# Patient Record
Sex: Female | Born: 1980
Health system: Southern US, Community
[De-identification: ages and names within clinical notes are randomized; demographics above are authoritative.]

## PROBLEM LIST (undated history)

## (undated) DIAGNOSIS — D6800 Von Willebrand disease, unspecified: Secondary | ICD-10-CM

## (undated) DIAGNOSIS — N200 Calculus of kidney: Secondary | ICD-10-CM

## (undated) DIAGNOSIS — L309 Dermatitis, unspecified: Secondary | ICD-10-CM

## (undated) DIAGNOSIS — D689 Coagulation defect, unspecified: Secondary | ICD-10-CM

## (undated) DIAGNOSIS — N189 Chronic kidney disease, unspecified: Secondary | ICD-10-CM

## (undated) DIAGNOSIS — B009 Herpesviral infection, unspecified: Secondary | ICD-10-CM

## (undated) DIAGNOSIS — N23 Unspecified renal colic: Secondary | ICD-10-CM

## (undated) DIAGNOSIS — F459 Somatoform disorder, unspecified: Secondary | ICD-10-CM

## (undated) DIAGNOSIS — K219 Gastro-esophageal reflux disease without esophagitis: Secondary | ICD-10-CM

## (undated) DIAGNOSIS — Z5189 Encounter for other specified aftercare: Secondary | ICD-10-CM

## (undated) HISTORY — PX: TONSILLECTOMY: SUR1361

## (undated) HISTORY — DX: Calculus of kidney: N20.0

## (undated) HISTORY — PX: OTHER SURGICAL HISTORY: SHX169

## (undated) HISTORY — DX: Herpesviral infection, unspecified: B00.9

## (undated) HISTORY — DX: Gastro-esophageal reflux disease without esophagitis: K21.9

## (undated) HISTORY — DX: Encounter for other specified aftercare: Z51.89

## (undated) HISTORY — DX: Coagulation defect, unspecified: D68.9

## (undated) HISTORY — DX: Somatoform disorder, unspecified: F45.9

## (undated) HISTORY — DX: Unspecified renal colic: N23

## (undated) HISTORY — DX: Dermatitis, unspecified: L30.9

## (undated) HISTORY — PX: KIDNEY STONE SURGERY: SHX686

## (undated) HISTORY — DX: Chronic kidney disease, unspecified: N18.9

## (undated) HISTORY — DX: Von Willebrand disease, unspecified: D68.00

---

## 2001-02-08 ENCOUNTER — Other Ambulatory Visit: Admission: RE | Admit: 2001-02-08 | Discharge: 2001-02-08 | Payer: Self-pay | Admitting: *Deleted

## 2002-07-29 ENCOUNTER — Other Ambulatory Visit: Admission: RE | Admit: 2002-07-29 | Discharge: 2002-07-29 | Payer: Self-pay | Admitting: Obstetrics and Gynecology

## 2003-08-17 ENCOUNTER — Other Ambulatory Visit: Admission: RE | Admit: 2003-08-17 | Discharge: 2003-08-17 | Payer: Self-pay | Admitting: Obstetrics and Gynecology

## 2003-09-14 ENCOUNTER — Ambulatory Visit (HOSPITAL_COMMUNITY): Admission: RE | Admit: 2003-09-14 | Discharge: 2003-09-14 | Payer: Self-pay

## 2003-12-18 ENCOUNTER — Emergency Department (HOSPITAL_COMMUNITY): Admission: EM | Admit: 2003-12-18 | Discharge: 2003-12-18 | Payer: Self-pay | Admitting: Emergency Medicine

## 2003-12-29 ENCOUNTER — Emergency Department (HOSPITAL_COMMUNITY): Admission: EM | Admit: 2003-12-29 | Discharge: 2003-12-29 | Payer: Self-pay | Admitting: Emergency Medicine

## 2005-03-09 ENCOUNTER — Inpatient Hospital Stay (HOSPITAL_COMMUNITY): Admission: AD | Admit: 2005-03-09 | Discharge: 2005-03-11 | Payer: Self-pay | Admitting: Obstetrics and Gynecology

## 2005-03-15 ENCOUNTER — Inpatient Hospital Stay (HOSPITAL_COMMUNITY): Admission: AD | Admit: 2005-03-15 | Discharge: 2005-03-18 | Payer: Self-pay | Admitting: Obstetrics and Gynecology

## 2005-06-10 ENCOUNTER — Emergency Department (HOSPITAL_COMMUNITY): Admission: EM | Admit: 2005-06-10 | Discharge: 2005-06-10 | Payer: Self-pay | Admitting: Family Medicine

## 2005-10-11 ENCOUNTER — Emergency Department (HOSPITAL_COMMUNITY): Admission: EM | Admit: 2005-10-11 | Discharge: 2005-10-11 | Payer: Self-pay | Admitting: Emergency Medicine

## 2006-01-15 ENCOUNTER — Ambulatory Visit: Payer: Self-pay | Admitting: Hematology & Oncology

## 2006-01-15 LAB — CBC WITH DIFFERENTIAL/PLATELET
Basophils Absolute: 0 10*3/uL (ref 0.0–0.1)
Eosinophils Absolute: 0.2 10*3/uL (ref 0.0–0.5)
HCT: 43.6 % (ref 34.8–46.6)
HGB: 15.1 g/dL (ref 11.6–15.9)
LYMPH%: 40.2 % (ref 14.0–48.0)
MCV: 92.1 fL (ref 81.0–101.0)
MONO#: 0.4 10*3/uL (ref 0.1–0.9)
MONO%: 5.3 % (ref 0.0–13.0)
NEUT#: 3.6 10*3/uL (ref 1.5–6.5)
Platelets: 328 10*3/uL (ref 145–400)
WBC: 7 10*3/uL (ref 3.9–10.0)

## 2006-01-15 LAB — CHCC SMEAR

## 2006-01-20 LAB — VON WILLEBRAND FACTOR MULTIMER: Von Willebrand Ag: 63 % normal (ref 61–164)

## 2006-11-06 ENCOUNTER — Ambulatory Visit (HOSPITAL_COMMUNITY): Admission: RE | Admit: 2006-11-06 | Discharge: 2006-11-06 | Payer: Self-pay | Admitting: Urology

## 2006-11-19 ENCOUNTER — Inpatient Hospital Stay (HOSPITAL_COMMUNITY): Admission: AD | Admit: 2006-11-19 | Discharge: 2006-11-19 | Payer: Self-pay | Admitting: Obstetrics and Gynecology

## 2006-11-26 ENCOUNTER — Ambulatory Visit (HOSPITAL_COMMUNITY): Admission: RE | Admit: 2006-11-26 | Discharge: 2006-11-26 | Payer: Self-pay | Admitting: *Deleted

## 2006-12-05 ENCOUNTER — Inpatient Hospital Stay (HOSPITAL_COMMUNITY): Admission: AD | Admit: 2006-12-05 | Discharge: 2006-12-07 | Payer: Self-pay | Admitting: Obstetrics & Gynecology

## 2007-01-02 ENCOUNTER — Ambulatory Visit (HOSPITAL_COMMUNITY): Admission: RE | Admit: 2007-01-02 | Discharge: 2007-01-02 | Payer: Self-pay | Admitting: Urology

## 2007-01-04 ENCOUNTER — Encounter: Admission: RE | Admit: 2007-01-04 | Discharge: 2007-02-03 | Payer: Self-pay | Admitting: *Deleted

## 2007-06-19 ENCOUNTER — Ambulatory Visit (HOSPITAL_BASED_OUTPATIENT_CLINIC_OR_DEPARTMENT_OTHER): Admission: RE | Admit: 2007-06-19 | Discharge: 2007-06-19 | Payer: Self-pay | Admitting: Urology

## 2007-09-01 ENCOUNTER — Emergency Department (HOSPITAL_COMMUNITY): Admission: EM | Admit: 2007-09-01 | Discharge: 2007-09-01 | Payer: Self-pay | Admitting: Emergency Medicine

## 2007-09-16 ENCOUNTER — Emergency Department (HOSPITAL_COMMUNITY): Admission: EM | Admit: 2007-09-16 | Discharge: 2007-09-16 | Payer: Self-pay | Admitting: Emergency Medicine

## 2008-01-11 ENCOUNTER — Inpatient Hospital Stay (HOSPITAL_COMMUNITY): Admission: AD | Admit: 2008-01-11 | Discharge: 2008-01-16 | Payer: Self-pay | Admitting: Obstetrics and Gynecology

## 2008-01-13 ENCOUNTER — Encounter: Payer: Self-pay | Admitting: Urology

## 2008-03-04 ENCOUNTER — Ambulatory Visit (HOSPITAL_COMMUNITY): Admission: RE | Admit: 2008-03-04 | Discharge: 2008-03-04 | Payer: Self-pay | Admitting: Obstetrics and Gynecology

## 2008-03-05 ENCOUNTER — Observation Stay (HOSPITAL_COMMUNITY): Admission: AD | Admit: 2008-03-05 | Discharge: 2008-03-07 | Payer: Self-pay | Admitting: Obstetrics & Gynecology

## 2008-03-14 ENCOUNTER — Inpatient Hospital Stay (HOSPITAL_COMMUNITY): Admission: AD | Admit: 2008-03-14 | Discharge: 2008-03-14 | Payer: Self-pay | Admitting: Obstetrics and Gynecology

## 2008-03-19 ENCOUNTER — Ambulatory Visit (HOSPITAL_COMMUNITY): Admission: RE | Admit: 2008-03-19 | Discharge: 2008-03-19 | Payer: Self-pay | Admitting: Urology

## 2008-04-10 ENCOUNTER — Ambulatory Visit: Payer: Self-pay | Admitting: Hematology & Oncology

## 2008-04-10 ENCOUNTER — Inpatient Hospital Stay (HOSPITAL_COMMUNITY): Admission: AD | Admit: 2008-04-10 | Discharge: 2008-04-11 | Payer: Self-pay | Admitting: Family Medicine

## 2008-04-13 LAB — CBC WITH DIFFERENTIAL (CANCER CENTER ONLY)
HCT: 38.1 % (ref 34.8–46.6)
HGB: 13.1 g/dL (ref 11.6–15.9)
MCV: 93 fL (ref 81–101)
RDW: 11.6 % (ref 10.5–14.6)
WBC: 15.7 10*3/uL — ABNORMAL HIGH (ref 3.9–10.0)

## 2008-04-13 LAB — PROTIME-INR (CHCC SATELLITE)

## 2008-04-13 LAB — MANUAL DIFFERENTIAL (CHCC SATELLITE)
ANC (CHCC HP manual diff): 11.8 10*3/uL — ABNORMAL HIGH (ref 1.5–6.7)
BASO: 1 % (ref 0–2)
Band Neutrophils: 6 % (ref 0–10)
Eos: 1 % (ref 0–7)
PLT EST ~~LOC~~: ADEQUATE

## 2008-04-20 ENCOUNTER — Ambulatory Visit (HOSPITAL_COMMUNITY): Admission: RE | Admit: 2008-04-20 | Discharge: 2008-04-20 | Payer: Self-pay | Admitting: Urology

## 2008-04-20 ENCOUNTER — Observation Stay (HOSPITAL_COMMUNITY): Admission: AD | Admit: 2008-04-20 | Discharge: 2008-04-21 | Payer: Self-pay | Admitting: Obstetrics & Gynecology

## 2008-04-20 LAB — APTT: aPTT: 35 seconds (ref 24–37)

## 2008-04-23 ENCOUNTER — Ambulatory Visit: Payer: Self-pay

## 2008-04-24 ENCOUNTER — Ambulatory Visit: Payer: Self-pay

## 2008-04-24 ENCOUNTER — Ambulatory Visit (HOSPITAL_COMMUNITY): Admission: RE | Admit: 2008-04-24 | Discharge: 2008-04-24 | Payer: Self-pay | Admitting: Urology

## 2008-05-26 ENCOUNTER — Ambulatory Visit (HOSPITAL_COMMUNITY): Admission: RE | Admit: 2008-05-26 | Discharge: 2008-05-26 | Payer: Self-pay | Admitting: Urology

## 2008-05-31 ENCOUNTER — Inpatient Hospital Stay (HOSPITAL_COMMUNITY): Admission: AD | Admit: 2008-05-31 | Discharge: 2008-06-02 | Payer: Self-pay | Admitting: Obstetrics and Gynecology

## 2008-06-01 ENCOUNTER — Encounter: Payer: Self-pay | Admitting: Urology

## 2008-06-03 ENCOUNTER — Encounter: Admission: RE | Admit: 2008-06-03 | Discharge: 2008-07-03 | Payer: Self-pay | Admitting: Obstetrics and Gynecology

## 2008-06-07 ENCOUNTER — Emergency Department (HOSPITAL_COMMUNITY): Admission: EM | Admit: 2008-06-07 | Discharge: 2008-06-08 | Payer: Self-pay | Admitting: Emergency Medicine

## 2008-06-09 ENCOUNTER — Ambulatory Visit (HOSPITAL_COMMUNITY): Admission: RE | Admit: 2008-06-09 | Discharge: 2008-06-09 | Payer: Self-pay | Admitting: Urology

## 2008-06-11 ENCOUNTER — Ambulatory Visit: Payer: Self-pay

## 2008-06-22 ENCOUNTER — Ambulatory Visit: Payer: Self-pay | Admitting: Vascular Surgery

## 2008-06-22 ENCOUNTER — Emergency Department (HOSPITAL_COMMUNITY): Admission: EM | Admit: 2008-06-22 | Discharge: 2008-06-22 | Payer: Self-pay | Admitting: Advanced Practice Midwife

## 2008-06-22 ENCOUNTER — Encounter (INDEPENDENT_AMBULATORY_CARE_PROVIDER_SITE_OTHER): Payer: Self-pay | Admitting: Emergency Medicine

## 2008-06-22 LAB — CONVERTED CEMR LAB
Pap Smear: NORMAL
Pap Smear: NORMAL
Pap Smear: NORMAL

## 2008-06-22 LAB — HM PAP SMEAR

## 2008-07-04 ENCOUNTER — Encounter: Admission: RE | Admit: 2008-07-04 | Discharge: 2008-07-22 | Payer: Self-pay | Admitting: Obstetrics and Gynecology

## 2008-08-19 ENCOUNTER — Ambulatory Visit: Payer: Self-pay | Admitting: Family Medicine

## 2008-08-19 DIAGNOSIS — N200 Calculus of kidney: Secondary | ICD-10-CM

## 2008-08-19 DIAGNOSIS — D6800 Von Willebrand disease, unspecified: Secondary | ICD-10-CM | POA: Insufficient documentation

## 2008-08-19 DIAGNOSIS — N209 Urinary calculus, unspecified: Secondary | ICD-10-CM

## 2008-08-19 DIAGNOSIS — Z87448 Personal history of other diseases of urinary system: Secondary | ICD-10-CM | POA: Insufficient documentation

## 2008-08-19 DIAGNOSIS — F4320 Adjustment disorder, unspecified: Secondary | ICD-10-CM | POA: Insufficient documentation

## 2008-08-19 HISTORY — DX: Urinary calculus, unspecified: N20.9

## 2008-09-08 ENCOUNTER — Encounter: Payer: Self-pay | Admitting: Family Medicine

## 2008-09-21 ENCOUNTER — Ambulatory Visit: Payer: Self-pay | Admitting: Hematology & Oncology

## 2008-09-24 ENCOUNTER — Ambulatory Visit: Payer: Self-pay | Admitting: Family Medicine

## 2008-09-24 DIAGNOSIS — L600 Ingrowing nail: Secondary | ICD-10-CM

## 2008-09-25 LAB — CONVERTED CEMR LAB
ALT: 22 units/L (ref 0–35)
AST: 20 units/L (ref 0–37)
Alkaline Phosphatase: 100 units/L (ref 39–117)
Bilirubin, Direct: 0 mg/dL (ref 0.0–0.3)
Chloride: 106 meq/L (ref 96–112)
GFR calc non Af Amer: 91.13 mL/min (ref >60)
Potassium: 4.3 meq/L (ref 3.5–5.1)
Sodium: 141 meq/L (ref 135–145)
Total Bilirubin: 0.7 mg/dL (ref 0.3–1.2)

## 2008-11-11 ENCOUNTER — Ambulatory Visit (HOSPITAL_COMMUNITY): Admission: RE | Admit: 2008-11-11 | Discharge: 2008-11-11 | Payer: Self-pay | Admitting: Sports Medicine

## 2008-12-14 ENCOUNTER — Encounter: Payer: Self-pay | Admitting: Family Medicine

## 2009-01-21 ENCOUNTER — Telehealth: Payer: Self-pay | Admitting: Family Medicine

## 2009-02-01 ENCOUNTER — Encounter: Admission: RE | Admit: 2009-02-01 | Discharge: 2009-02-01 | Payer: Self-pay | Admitting: Internal Medicine

## 2009-05-07 ENCOUNTER — Telehealth: Payer: Self-pay | Admitting: Family Medicine

## 2009-05-13 ENCOUNTER — Telehealth: Payer: Self-pay | Admitting: Internal Medicine

## 2009-05-17 ENCOUNTER — Emergency Department (HOSPITAL_COMMUNITY): Admission: EM | Admit: 2009-05-17 | Discharge: 2009-05-17 | Payer: Self-pay | Admitting: Emergency Medicine

## 2009-05-18 ENCOUNTER — Ambulatory Visit: Payer: Self-pay | Admitting: Unknown Physician Specialty

## 2009-05-22 ENCOUNTER — Ambulatory Visit: Payer: Self-pay | Admitting: Unknown Physician Specialty

## 2009-06-15 ENCOUNTER — Ambulatory Visit: Payer: Self-pay | Admitting: Family Medicine

## 2009-06-15 DIAGNOSIS — R1011 Right upper quadrant pain: Secondary | ICD-10-CM

## 2009-06-18 LAB — CONVERTED CEMR LAB
Albumin: 4.7 g/dL (ref 3.5–5.2)
BUN: 12 mg/dL (ref 6–23)
Basophils Absolute: 0 10*3/uL (ref 0.0–0.1)
CO2: 26 meq/L (ref 19–32)
Calcium: 9.8 mg/dL (ref 8.4–10.5)
Eosinophils Absolute: 0.4 10*3/uL (ref 0.0–0.7)
GFR calc non Af Amer: 105.75 mL/min (ref >60)
Glucose, Bld: 97 mg/dL (ref 70–99)
HCT: 45.2 % (ref 36.0–46.0)
Hemoglobin: 15.2 g/dL — ABNORMAL HIGH (ref 12.0–15.0)
Lipase: 27 units/L (ref 11.0–59.0)
Lymphocytes Relative: 27.6 % (ref 12.0–46.0)
Lymphs Abs: 3.5 10*3/uL (ref 0.7–4.0)
MCHC: 33.5 g/dL (ref 30.0–36.0)
Monocytes Relative: 5.9 % (ref 3.0–12.0)
Neutro Abs: 8.1 10*3/uL — ABNORMAL HIGH (ref 1.4–7.7)
Platelets: 323 10*3/uL (ref 150.0–400.0)
RDW: 11.6 % (ref 11.5–14.6)
Sodium: 139 meq/L (ref 135–145)
Total Bilirubin: 0.7 mg/dL (ref 0.3–1.2)

## 2009-06-21 ENCOUNTER — Ambulatory Visit: Payer: Self-pay | Admitting: Family Medicine

## 2009-06-21 ENCOUNTER — Ambulatory Visit: Payer: Self-pay | Admitting: Cardiology

## 2009-06-21 LAB — CONVERTED CEMR LAB
Basophils Relative: 0.8 % (ref 0.0–3.0)
Eosinophils Relative: 3.5 % (ref 0.0–5.0)
HCT: 42.6 % (ref 36.0–46.0)
Hemoglobin: 14.7 g/dL (ref 12.0–15.0)
Lymphocytes Relative: 30.1 % (ref 12.0–46.0)
Lymphs Abs: 3.1 10*3/uL (ref 0.7–4.0)
Monocytes Relative: 5.5 % (ref 3.0–12.0)
Neutro Abs: 6 10*3/uL (ref 1.4–7.7)
RBC: 4.44 M/uL (ref 3.87–5.11)

## 2009-06-22 ENCOUNTER — Ambulatory Visit: Payer: Self-pay | Admitting: Unknown Physician Specialty

## 2009-12-16 ENCOUNTER — Ambulatory Visit: Payer: Self-pay | Admitting: Family Medicine

## 2009-12-16 DIAGNOSIS — Z9189 Other specified personal risk factors, not elsewhere classified: Secondary | ICD-10-CM | POA: Insufficient documentation

## 2010-02-16 ENCOUNTER — Ambulatory Visit: Payer: Self-pay | Admitting: Family Medicine

## 2010-03-24 IMAGING — CR DG NEPHROSTOGRAM RIGHT
1 series · 1 of 1 positions shown · IV contrast (agent unspecified)
Comparison: 05/26/2008

CLINICAL HISTORY: Chronic right ureteral obstruction.  Right
nephrostomy required during pregnancy.  The patient's pregnancy end
of yesterday with delivery.

SP NEPHROSTOGRAM*R*
TECHNIQUE: Fluoroscopic guided injection of contrast into the
right urinary tract via existing nephrostomy catheter
Contrast:  50 ml Umnipaque-TBB.
Fluoro time 1.4 minutes.

[view not recorded]
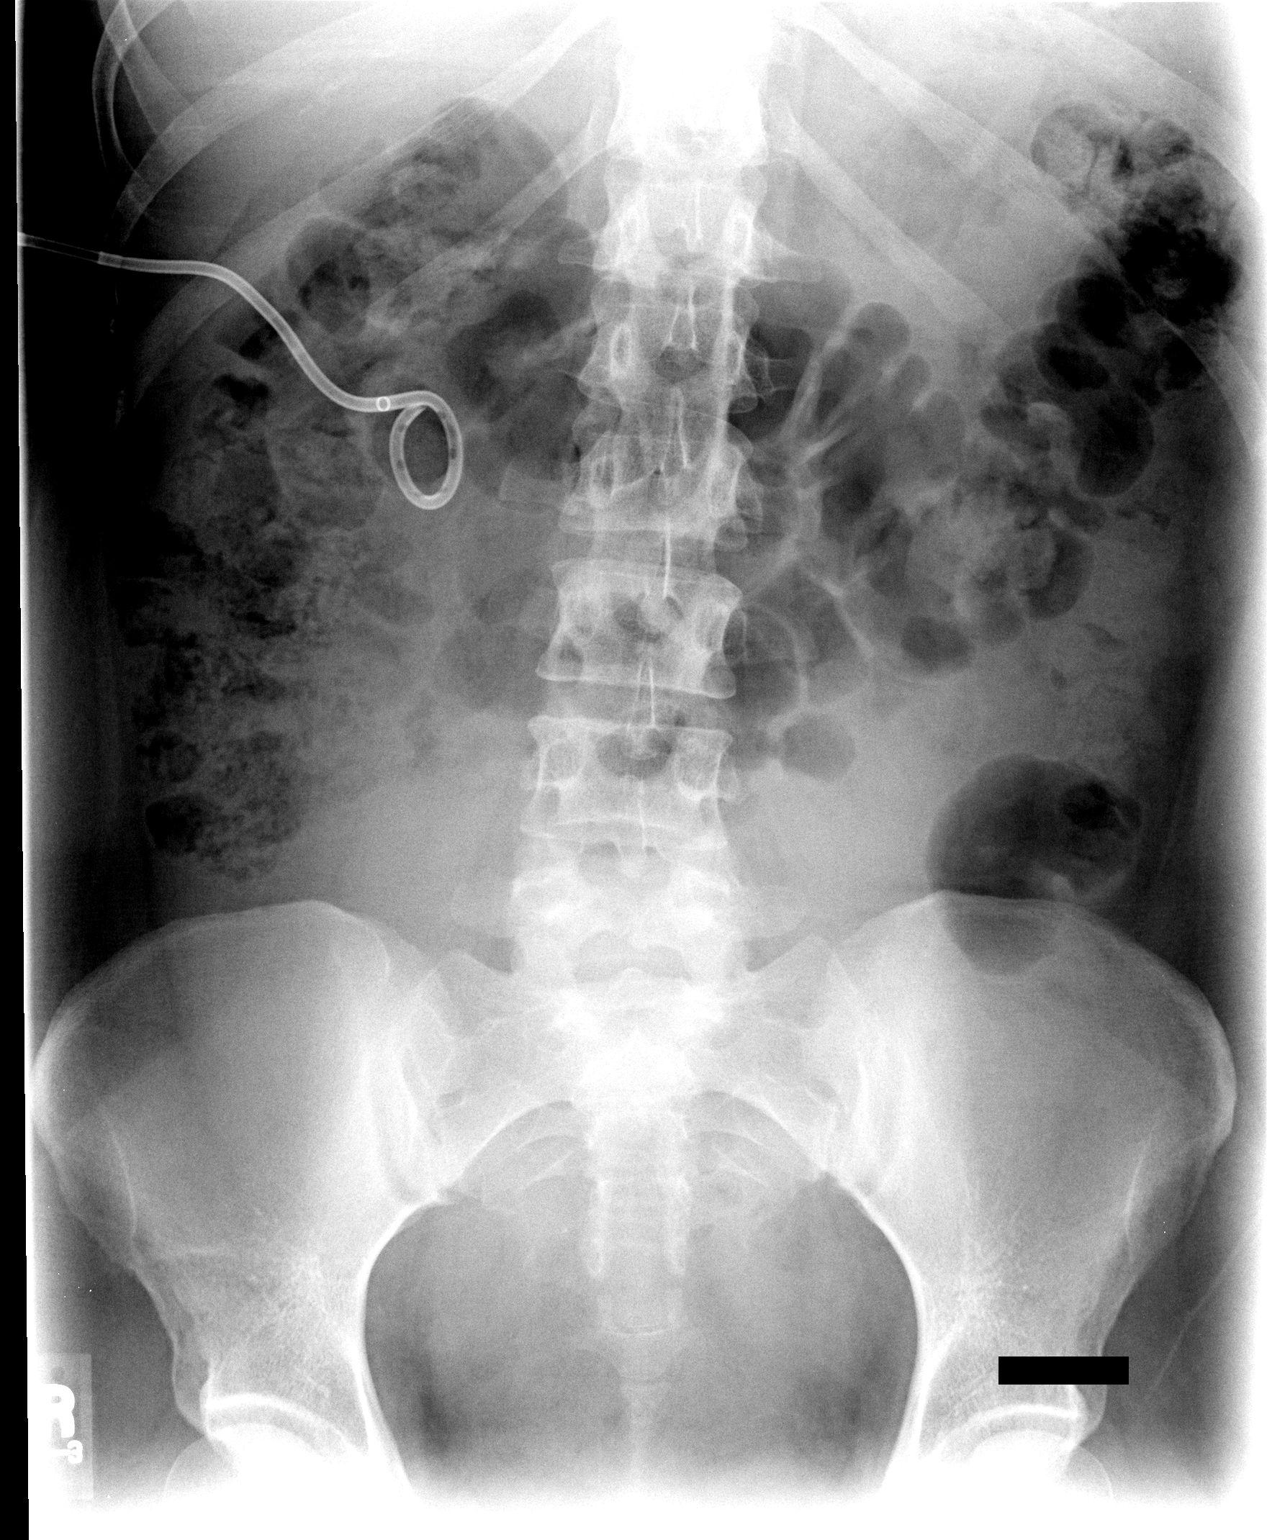

[1 of 1 positions shown; findings below may reference images not displayed]

FINDINGS: The right intra - renal collecting system is moderately
dilated.  The upper half of the ureter is markedly tortuous and
moderately dilated.  The lower half of the ureter is mildly
dilated.  There are no visible stones, filling defects, or
obstruction.
IMPRESSION: Dilated system, especially the upper ureter and
intrarenal components, without stones or obstruction.

## 2010-03-31 IMAGING — CT CT ABDOMEN W/O CM
2 of 4 series · 17 of 46 positions shown, 19 images · non-contrast
Comparison: 03/17/2005

CT ABDOMEN

CLINICAL DATA: Right flank pain, fever, recently postpartum,
ago

CT ABDOMEN AND PELVIS WITHOUT CONTRAST
TECHNIQUE: Multidetector CT imaging of the abdomen and pelvis was
performed following the standard protocol without intravenous
contrast.

[Series 2: 160 stone 5.0 b40f · axial · 0.60mm/px · z∈[-496,-152]mm · 14 of 97 slices shown, 16 images]
[im 7/97  soft-tissue]
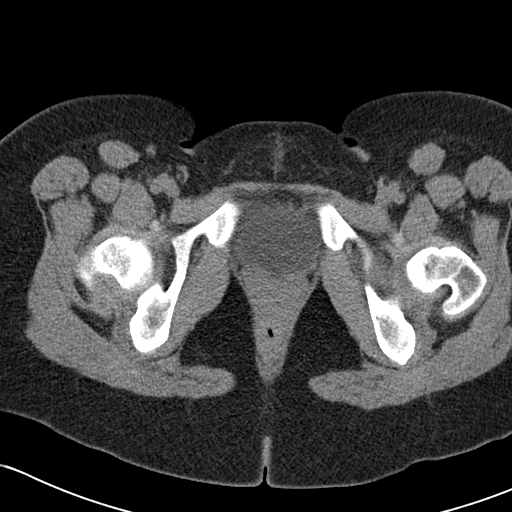
[im 7/97  bone]
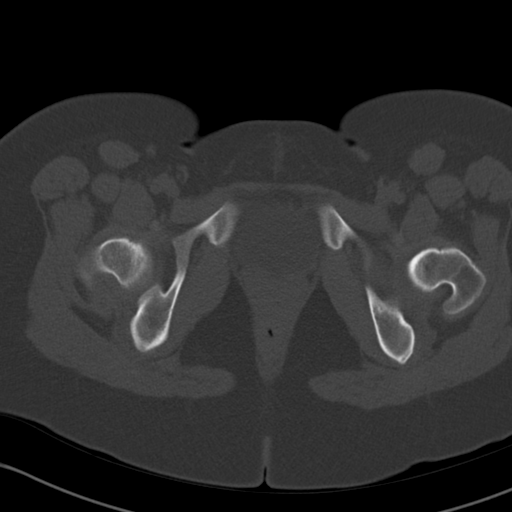
[im 14/97  soft-tissue]
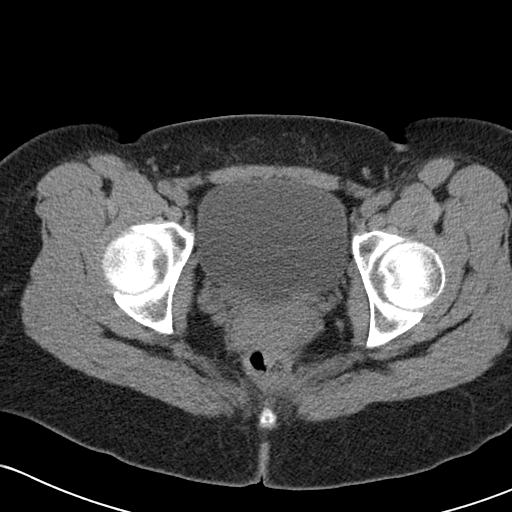
[im 20/97  soft-tissue]
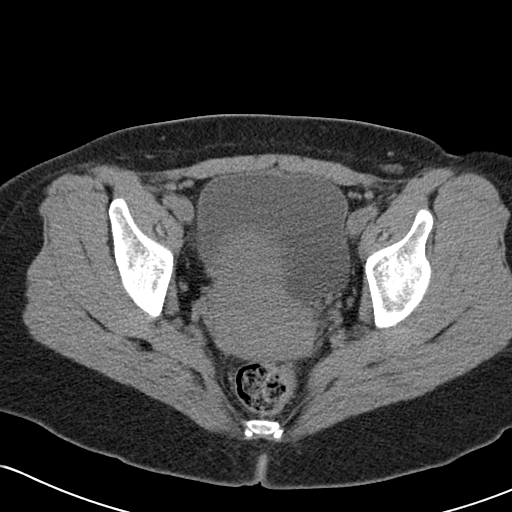
[im 27/97  soft-tissue]
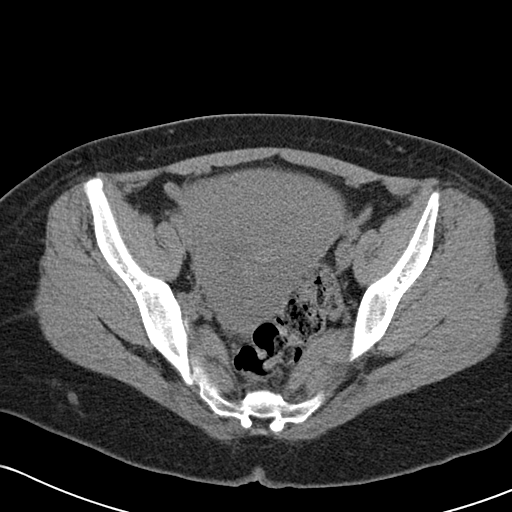
[im 34/97  soft-tissue]
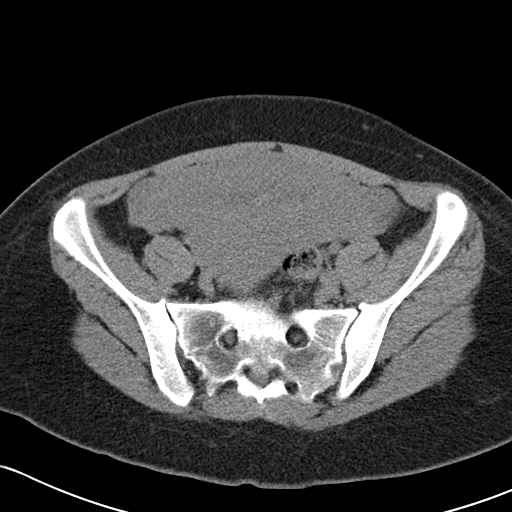
[im 40/97  soft-tissue]
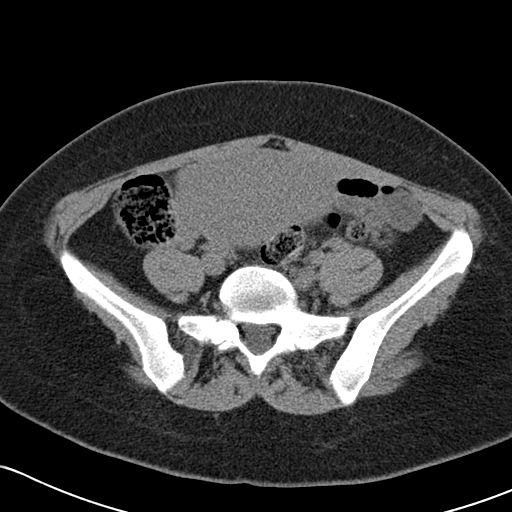
[im 47/97  soft-tissue]
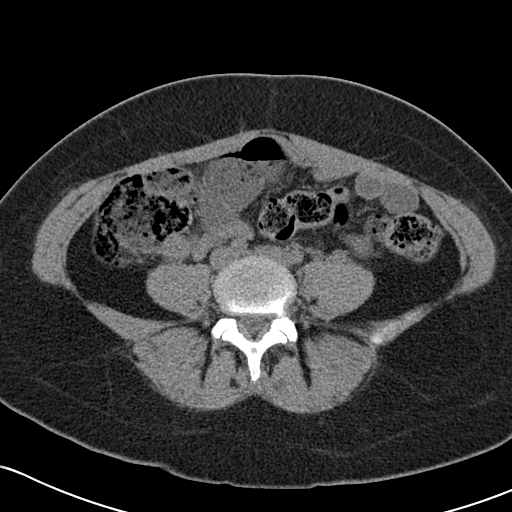
[im 53/97  soft-tissue]
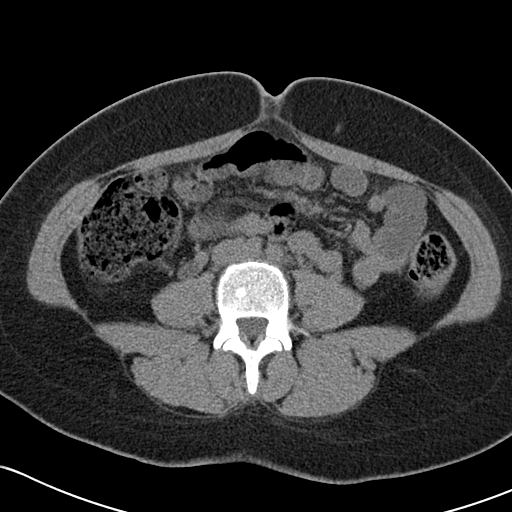
[im 60/97  soft-tissue]
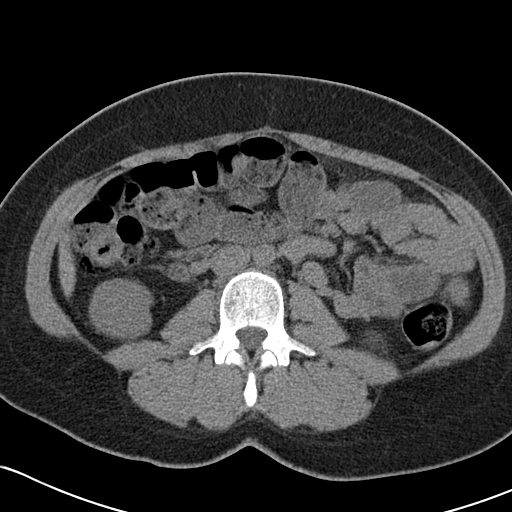
[im 60/97  bone]
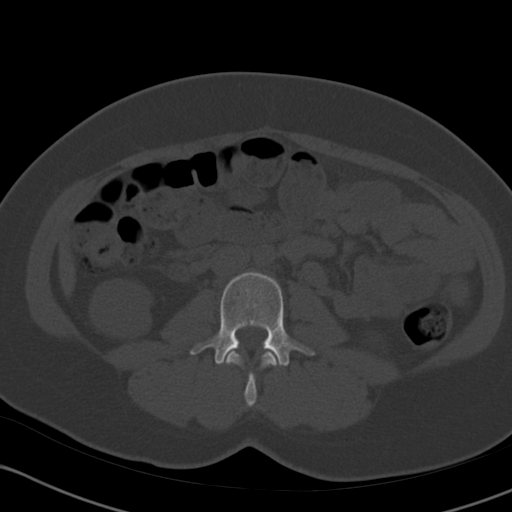
[im 67/97  soft-tissue]
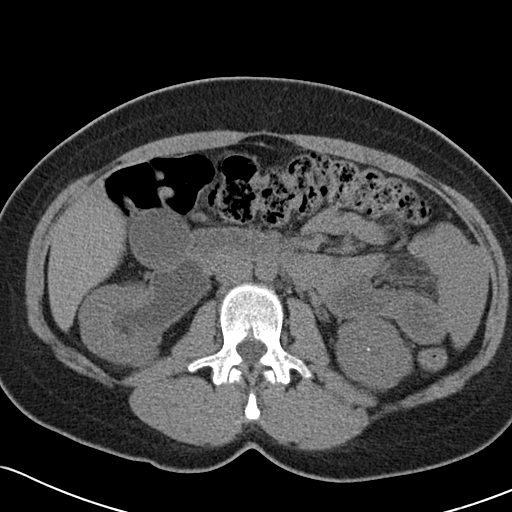
[im 73/97  soft-tissue]
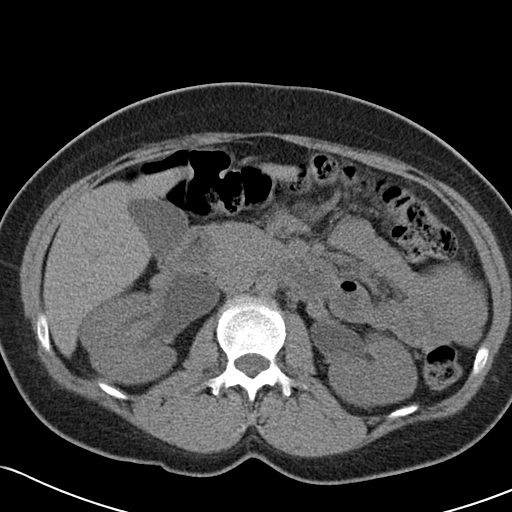
[im 80/97  soft-tissue]
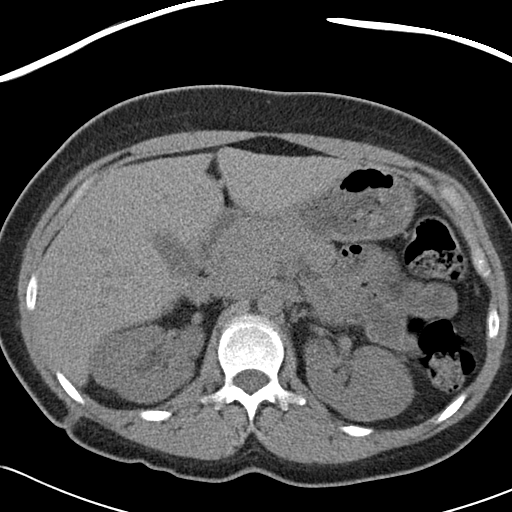
[im 87/97  soft-tissue]
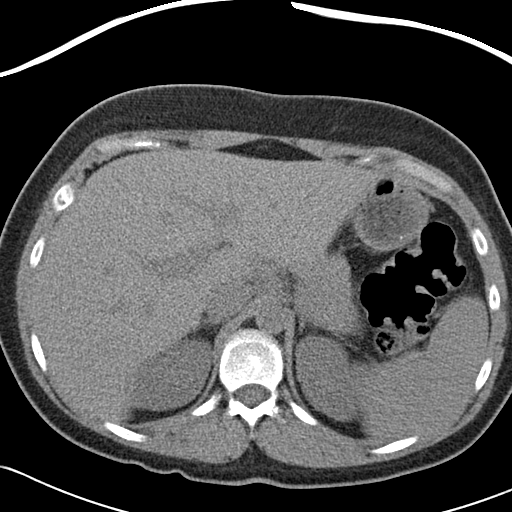
[im 93/97  soft-tissue]
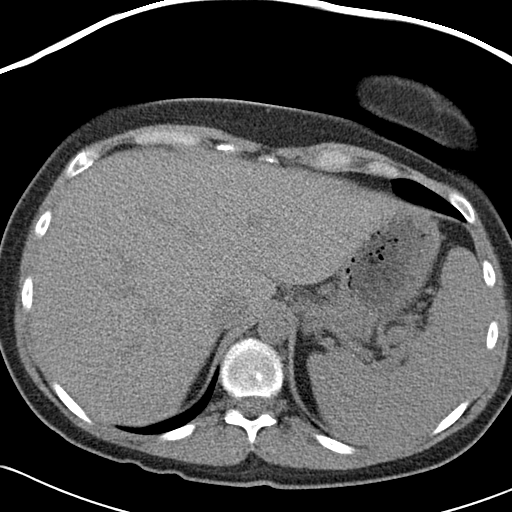

[Series 602: <mpr thick range> · coronal · 0.76mm/px · 3 of 67 slices shown]
[im 23/67  soft-tissue]
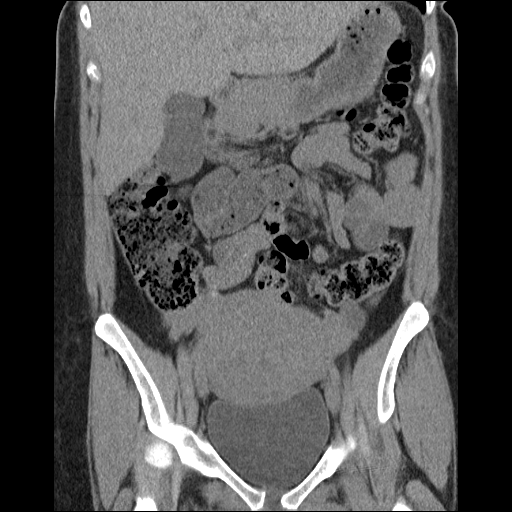
[im 30/67  soft-tissue]
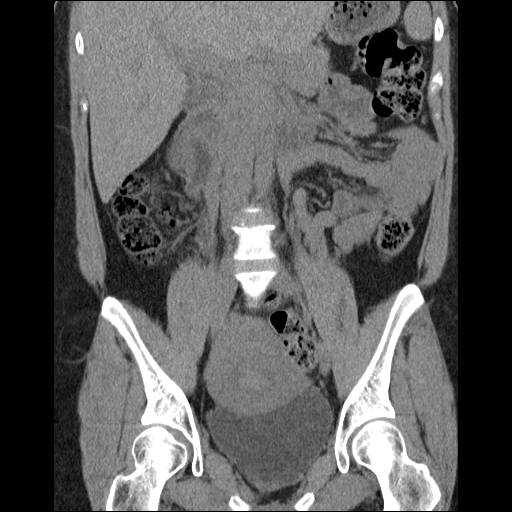
[im 37/67  soft-tissue]
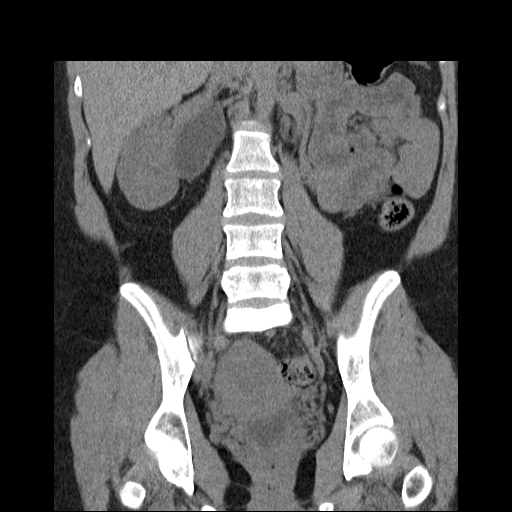

[17 of 46 positions shown; findings below may reference images not displayed]

FINDINGS: Right hydronephrosis with dilatation of proximal right ureter again
identified.
Degree of right hydronephrosis is improved versus 9776 exam.
Proximal ureteral dilatation terminates just above iliac crests,
without obstructing calculus identified.
Spleen appears minimally prominent in size.
Within limits of a nonenhanced exam, no additional focal
abnormalities of upper abdominal solid organs and bowel loops.
IMPRESSION: Mild to moderate right hydronephrosis with proximal right ureteral
dilatation, terminating just above iliac crest, of uncertain
etiology, with no stone identified.

CT PELVIS
FINDINGS: Enlarged uterus compatible with postpartum state.
Central high attenuation within uterus may reflect residual blood
in endometrial canal or less likely retained products of
conception.
Bladder distal ureters normal.
Large and small bowel loops in pelvis normal.
Appendix not definitely localized.
No pelvic mass, adenopathy, or free fluid.
No acute bony abnormalities seen.
IMPRESSION: Enlarged uterus with central high attenuation compatible with
postpartum state, cannot exclude endometrial blood or retained
products of conception.
No other intrapelvic abnormalities.

## 2010-04-18 ENCOUNTER — Telehealth (INDEPENDENT_AMBULATORY_CARE_PROVIDER_SITE_OTHER): Payer: Self-pay | Admitting: *Deleted

## 2010-04-19 ENCOUNTER — Ambulatory Visit: Payer: Self-pay | Admitting: Family Medicine

## 2010-04-20 LAB — CONVERTED CEMR LAB
Mumps IgG: 1.99 — ABNORMAL HIGH
Rubeola IgG: 2.31 — ABNORMAL HIGH

## 2010-04-21 ENCOUNTER — Ambulatory Visit: Payer: Self-pay | Admitting: Family Medicine

## 2010-05-23 ENCOUNTER — Ambulatory Visit
Admission: RE | Admit: 2010-05-23 | Discharge: 2010-05-23 | Payer: Self-pay | Source: Home / Self Care | Attending: Family Medicine | Admitting: Family Medicine

## 2010-05-23 DIAGNOSIS — J4520 Mild intermittent asthma, uncomplicated: Secondary | ICD-10-CM | POA: Insufficient documentation

## 2010-06-12 ENCOUNTER — Encounter: Payer: Self-pay | Admitting: Urology

## 2010-06-12 ENCOUNTER — Encounter: Payer: Self-pay | Admitting: *Deleted

## 2010-06-21 NOTE — Letter (Signed)
Summary: Physical Exam Form/[employer]  Physical Exam Form/[employer]   Imported By: Lanelle Bal 12/21/2009 11:03:40  _____________________________________________________________________  External Attachment:    Type:   Image     Comment:   External Document

## 2010-06-21 NOTE — Progress Notes (Signed)
Summary: needs MMR titre  Phone Note Call from Patient Call back at Work Phone [phone]   Caller: Patient Call For: Kerby Nora MD Summary of Call: Pt is coming in tomorrow for a PPD and is asking if she can get a MMR titre.  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 Initial call taken by: Lowella Petties CMA, AAMA,  April 18, 2010 5:03 PM  Follow-up for Phone Call        Yes MMR titer Dx v05.8 Follow-up by: Kerby Nora MD,  April 19, 2010 11:24 AM  Additional Follow-up for Phone Call Additional follow up Details #1::        Patient will not be her until 4:30 Additional Follow-up by: [name] CMA Duncan Dull),  April 19, 2010 11:28 AM    Additional Follow-up for Phone Call Additional follow up Details #2::    Noted. Follow-up by: Mills Koller,  April 19, 2010 11:58 AM

## 2010-06-21 NOTE — Assessment & Plan Note (Signed)
Summary: ppd reading/jrr bedsole  Nurse Visit   Allergies: 1)  ! Keflex  PPD Results    Date of reading: 04/21/2010    Results: < 5mm    Interpretation: negative

## 2010-06-21 NOTE — Assessment & Plan Note (Signed)
Summary: COLLEGE PHYSICAL,FILL OUT FORM/CLE   Vital Signs:  Patient profile:   30 year old female Height:      66.25 inches Weight:      210.6 pounds BMI:     33.86 Temp:     99.2 degrees F oral Pulse rate:   76 / minute Pulse rhythm:   regular BP sitting:   110 / 70  (left arm) Cuff size:   large  Vitals Entered By: [name] CMA Duncan Dull) (December 16, 2009 8:26 AM)  Vision Screening:Left eye with correction: 20 / 20 Right eye with correction: 20 / 20 Both eyes with correction: 20 / 20  Color vision testing: normal      Vision Entered By: [name] CMA Duncan Dull) (December 16, 2009 8:26 AM)   History of Present Illness: 30 year old going to college who needs verification of all vaccines, forms completed, feeling well, 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 Potential exposure and immunity titers are needed  ROS: GEN: No acute illnesses, no fevers, chills, sweats, fatigue, weight loss, or URI sx. GI: No n/v/d Pulm: No SOB, cough, wheezing Interactive and getting along well at home.  Otherwise, ROS is as per the HPI.   GEN: WDWN, NAD, Non-toxic, A & O x 3 HEENT: Atraumatic, Normocephalic. Neck supple. No masses, No LAD. Ears and Nose: No external deformity. CV: RRR, No M/G/R. No JVD. No thrill. No extra heart sounds. PULM: CTA B, no wheezes, crackles, rhonchi. No retractions. No resp. distress. No accessory muscle use. ABD: S, NT, ND, +BS. No rebound tenderness. No HSM.  EXTR: No c/c/e NEURO: Normal gait.  PSYCH: Normally interactive. Conversant. Not depressed or anxious appearing.  Calm demeanor.    Allergies: 1)  ! Keflex   Impression & Recommendations:  Problem # 1:  CNTC W/OR EXPOSURE UNSPEC COMMUNICABLE DISEASE (ICD-V01.9) check all  forms completed  Problem # 2:  HEPATITIS B IMMUNITY (ICD-V05.8)  Orders: T- * Misc. Laboratory test [phone]) Specimen Handling (60454) Venipuncture (09811)  Problem # 3:  HEPATITIS B IMMUNITY (ICD-V05.8)  Orders: T- * Misc.  Laboratory test [phone]) Specimen Handling (29562) Venipuncture [phone])  Complete Medication List: 1)  Mirena 20 Mcg/24hr Iud (Levonorgestrel)  Other Orders: Injectable Polio (IPV) [phone]) Admin 1st Vaccine (96295)  Current Allergies (reviewed today): ! KEFLEX    Immunizations Administered:  Polio Vaccine # 1:    Vaccine Type: IPV    Site: left deltoid    Mfr: Sanofi Pasteur    Dose: 0.5 ml    Route: IM    Given by: [name] CMA (AAMA)    Exp. Date: 10/31/2014    Lot #: [id number]    VIS given: 05/22/98 version given December 16, 2009.  Appended Document: COLLEGE PHYSICAL,FILL OUT FORM/CLE Proper ICD-code, v15.9: to verify varicella immunity v05.8= to verify Hep B immunity

## 2010-06-21 NOTE — Assessment & Plan Note (Signed)
Summary: LUMP IN UPPER ABD/RBH   Vital Signs:  Patient profile:   30 year old female Height:      66.25 inches Weight:      199.6 pounds BMI:     32.09 Temp:     99.0 degrees F oral Pulse rate:   68 / minute Pulse rhythm:   regular BP sitting:   108 / 72  (left arm) Cuff size:   large  Vitals Entered By: [name] CMA Duncan Dull) (June 15, 2009 12:22 PM)  History of Present Illness: Chief complaint lump in upper abdomen  Noted 6 months ago..lump under right rib cage...forgot about it until last week when became tender medial to lump. Mild increase in size. no redness, no discharge.  Some increase indigestion but not major. Soreness constatnt..not sure what makes it better or worse. NPO fever.  Has history of kidney stones, but this does not feels like that.  Has history of nephrostomy tube on right b   Problems Prior to Update: 1)  Ingrowing Nail  (ICD-703.0) 2)  Oth General Medical Examination Admin Purposes  (ICD-V70.3) 3)  Adjustment Disorder  (ICD-309.9) 4)  Pyelonephritis, Hx of  (ICD-V13.00) 5)  Von Willebrand's Disease  (ICD-286.4) 6)  Nephrolithiasis, Hx of  (ICD-V13.01)  Current Medications (verified): 1)  Zoloft 100 Mg Tabs (Sertraline Hcl) .... Take 1 Tablet By Mouth Once A Day 2)  Alprazolam 1 Mg Tabs (Alprazolam) .... Take 1 Tablet By Mouth Two Times A Day As Needed 3)  Mirena 20 Mcg/24hr Iud (Levonorgestrel) 4)  Ondansetron Hcl 8 Mg Tabs (Ondansetron Hcl) .Marland Kitchen.. 1 Tab By Mouth Q 6 Hours As Needed Nausea 5)  First-Mouthwash Blm  Susp (Dph-Lido-Alhydr-Mghydr-Simeth) .... Dukes Mouthwash: 5cc Swish and Spit Four Times A Day Until Improved  Allergies: 1)  ! Keflex  Past History:  Past medical, surgical, family and social histories (including risk factors) reviewed, and no changes noted (except as noted below).  Past Surgical History: Reviewed history from 08/19/2008 and no changes required. kidney stone removal, right nephrostomay tube 2008, 2009 NSVD  x 3  Family History: Reviewed history from 08/19/2008 and no changes required. father:DM, HTN mother: healthy only child CAD both grandparents,  PGF: DM, MI age 2 MGF: age 24 CABG aunt breast cancer  Social History: Reviewed history from 08/19/2008 and no changes required. 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 CV:  Denies chest pain or discomfort. Resp:  Denies shortness of breath. GI:  Denies abdominal pain, constipation, and diarrhea. GU:  Denies dysuria.  Physical Exam  General:  overweight female inNAD Mouth:  MMM Neck:  no carotid bruit or thyromegaly no cervical or supraclavicular lymphadenopathy  Lungs:  Normal respiratory effort, chest expands symmetrically. Lungs are clear to auscultation, no crackles or wheezes. Heart:  Normal rate and regular rhythm. S1 and S2 normal without gallop, murmur, click, rub or other extra sounds. Abdomen:  soft, non-tender, normal bowel sounds, no distention, no guarding, no rebound tenderness, no hepatomegaly, and no splenomegaly.   Small very deep increase in density.Marland Kitchenno clearly a mass, no edges.  Skin:  Intact without suspicious lesions or rashes   Impression & Recommendations:  Problem # 1:  ABDOMINAL PAIN, RIGHT UPPER QUADRANT (ICD-789.01) Not clear mass..no signs of infection, no toxic appearing. Will  begin with labs to eval pancreas , liver, wbc etc. Pt to call if pain increasing or new symptoms. Orders: TLB-BMP (Basic Metabolic Panel-BMET) (80048-METABOL) TLB-CBC Platelet - w/Differential (85025-CBCD) TLB-Hepatic/Liver Function Pnl (80076-HEPATIC) TLB-Lipase (83690-LIPASE)  Complete Medication List: 1)  Zoloft 100 Mg Tabs (Sertraline hcl) .... Take 1 tablet by  mouth once a day 2)  Alprazolam 1 Mg Tabs (Alprazolam) .... Take 1 tablet by mouth two times a day as needed 3)  Mirena 20 Mcg/24hr Iud (Levonorgestrel) 4)  Ondansetron Hcl 8 Mg Tabs (Ondansetron hcl) .Marland Kitchen.. 1 tab by mouth q 6 hours as needed nausea 5)  First-mouthwash Blm Susp (Dph-lido-alhydr-mghydr-simeth) .... Dukes mouthwash: 5cc swish and spit four times a day until improved  Patient Instructions: 1)  Fish oil, omega 3 fatty acids  2000 mg divided daily. 2)  Low fat diet, avoid processed foods.  3)  Recheck fasting lipids  in 3 months Dx 272.0    4)  Start prilosec 20-40 mg daily. 5)  Avoid spicy foods, tomato,  citris, caffeine, peppermint. 6)  Decrease ETOH.  7)  Follow up in 2 weeks, call sooner if new symptoms.  Current Allergies (reviewed today): ! [facility]

## 2010-06-21 NOTE — Assessment & Plan Note (Signed)
Summary: ppd skin test, bedsole  Nurse Visit   Allergies: 1)  ! Keflex  Immunizations Administered:  PPD Skin Test:    Vaccine Type: PPD    Site: left forearm    Mfr: Sanofi Pasteur    Dose: 0.1 ml    Route: ID    Given by: Lewanda Rife LPN    Exp. Date: 03/24/2011    Lot #: [id number]  Orders Added: 1)  TB Skin Test [86580] 2)  Admin 1st Vaccine [54098]

## 2010-06-21 NOTE — Assessment & Plan Note (Signed)
Summary: FEVER,COUGH/CLE   Vital Signs:  Patient profile:   30 year old female Height:      66.25 inches Weight:      211 pounds BMI:     33.92 Temp:     99.4 degrees F oral Pulse rate:   80 / minute Pulse rhythm:   regular BP sitting:   130 / 90  (left arm) Cuff size:   regular  Vitals Entered By: Linde Gillis CMA Duncan Dull) (February 16, 2010 11:58 AM) CC: fever, cough   History of Present Illness: 30 yo with one week of worsening URI  symptoms. Started with cough and runny nose. Last night, spiked temp to 103. Fever did come down with Tylenol and Advil. Felt like she was wheezing this morning, cough is also now productive. Not able to sleep at night due to coughing spells. Taking Robitussin OTC with no relief of cough.  Current Medications (verified): 1)  Mirena 20 Mcg/24hr Iud (Levonorgestrel) 2)  Azithromycin 250 Mg  Tabs (Azithromycin) .... 2 By  Mouth Today and Then 1 Daily For 4 Days 3)  Cheratussin Ac 100-10 Mg/51ml Syrp (Guaifenesin-Codeine) .... 5 Ml By Mouth At Bedtime As Needed Cough  Allergies: 1)  ! 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 ENT:  Complains of postnasal drainage, sinus pressure, and sore throat; denies earache. CV:  Denies chest pain or discomfort. Resp:   Complains of cough, sputum productive, and wheezing; denies shortness of breath.  Physical Exam  General:  alert, well-developed, well-nourished, and well-hydrated.   VSS Ears:  External ear exam shows no significant lesions or deformities.  Otoscopic examination reveals clear canals, tympanic membranes are intact bilaterally without bulging, retraction, inflammation or discharge. Hearing is grossly normal bilaterally. Nose:  mucosal erythema and mucosal edema, no obstruciton Frontal sinsues TTP Mouth:  pharyngeal erythema.   Lungs:  Normal respiratory effort, chest expands symmetrically.  Scant exp wheeze in LLB, right lung clear. Heart:  Normal rate and regular rhythm. S1 and S2 normal without gallop, murmur, click, rub or other extra sounds. Extremities:  no edema Psych:  Cognition and judgment appear intact. Alert and cooperative with normal attention span and concentration. No apparent delusions, illusions, hallucinations   Impression & Recommendations:  Problem # 1:  URI (ICD-465.9) Assessment New Given duration and progression of symptoms, will treat for bacterial sinusitis/bronchitis with Zpack. Cheratussin as needed coughing spells. See pt instructions for details. Her updated medication list for this problem includes:    Cheratussin Ac 100-10 Mg/32ml Syrp (Guaifenesin-codeine) .Marland KitchenMarland KitchenMarland KitchenMarland Kitchen 5 ml by mouth at bedtime as needed cough  Complete Medication List: 1)  Mirena 20 Mcg/24hr Iud (Levonorgestrel) 2)  Azithromycin 250 Mg Tabs (Azithromycin) .... 2 by  mouth today and then 1 daily for 4 days 3)  Cheratussin Ac 100-10 Mg/17ml Syrp (Guaifenesin-codeine) .... 5 ml by mouth at bedtime as needed cough  Patient Instructions: 1)  Take antibiotic as directed.  Drink lots of fluids.  Treat sympotmatically with Mucinex, nasal saline irrigation, and Tylenol/Ibuprofen. Also try claritin D or zyrtec D over the counter- two times a day as needed ( have to sign for them at pharmacy). You can use  warm compresses.  Cough suppressant at night. Call if not improving as expected in 5-7 days.  Prescriptions: CHERATUSSIN AC 100-10 MG/5ML SYRP (GUAIFENESIN-CODEINE) 5 ml by mouth at bedtime as needed cough  #5 ounces x 0   Entered and Authorized by:   Ruthe Mannan MD   Signed by:   Ruthe Mannan MD on 02/16/2010   Method used:   Print then Give to Patient   RxID:   [id number] AZITHROMYCIN 250 MG  TABS (AZITHROMYCIN) 2 by  mouth today and then 1 daily for 4 days  #6 x 0   Entered and Authorized by:   Ruthe Mannan MD   Signed by:   Ruthe Mannan MD on 02/16/2010   Method used:   Electronically to        [facility]* (retail)       [address]       [address] [place] [facility]       [place], [place]  [address]       Ph: [phone]       Fax: [phone]   RxID:   [id number]   Current Allergies (reviewed today): ! [facility]

## 2010-06-21 NOTE — Miscellaneous (Signed)
Summary: Vaccine Record/[place]  Vaccine Record/   Imported By: Lanelle Bal 12/21/2009 11:04:41  _____________________________________________________________________  External Attachment:    Type:   Image     Comment:   External Document

## 2010-06-23 NOTE — Assessment & Plan Note (Signed)
Summary: cough/jbb   Vital Signs:  Patient Profile:   30 Years Old Female CC:      Cough Height:     66.25 inches Weight:      219 pounds O2 Sat:      100 % O2 treatment:    Room Air Temp:     99.4 degrees F oral Pulse rate:   96 / minute Pulse rhythm:   regular Resp:     24 per minute BP sitting:   130 / 86  (left arm) Cuff size:   large  Vitals Entered By: Providence Crosby LPN (May 23, 2010 2:55 PM)                  Current Allergies: ! KEFLEXHistory of Present Illness History from: patient Reason for visit: see chief complaint Chief Complaint: Cough History of Present Illness: Cough started saturday 05/21/2010; sore throat ;wheezing; dry cough, pt has had asthma since she was a child and reports that she only has flare up when the weather gets bad.  She says that she otherwise has had no abd pain, no CP, occasional SOB, mucopurulent postnasal drainage reported, no vomiting, no headache.  She has been using her ventolin inhaler with some relief in symptoms but is having lots of cough and not able to sleep at night.    REVIEW OF SYSTEMS Constitutional Symptoms       Complains of fever.     Denies chills, night sweats, weight loss, weight gain, and fatigue.  Eyes       Denies change in vision, eye pain, eye discharge, glasses, contact lenses, and eye surgery. Ear/Nose/Throat/Mouth       Complains of sore throat.      Denies hearing loss/aids, change in hearing, ear pain, ear discharge, dizziness, frequent runny nose, frequent nose bleeds, sinus problems, hoarseness, and tooth pain or bleeding.  Respiratory       Complains of dry cough, wheezing, and bronchitis.      Denies productive cough, shortness of breath, asthma, and emphysema/COPD.  Cardiovascular       Denies murmurs, chest pain, and tires easily with exhertion.    Gastrointestinal       Denies stomach pain, nausea/vomiting, diarrhea, constipation, blood in bowel movements, and indigestion. Genitourniary  Denies painful urination, kidney stones, and loss of urinary control. Neurological       Denies paralysis, seizures, and fainting/blackouts. Musculoskeletal       Denies muscle pain, joint pain, joint stiffness, decreased range of motion, redness, swelling, muscle weakness, and gout.  Skin       Denies bruising, unusual mles/lumps or sores, and hair/skin or nail changes.  Psych       Denies mood changes, temper/anger issues, anxiety/stress, speech problems, depression, and sleep problems.  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, [employer],  Museum/gallery curator in 09/2008 Married Children, 3 healthy Never Smoked Alcohol use-yes, 2 drinks two times a week Drug use-no Regular exercise-no Diet: fruits, veggies, water limited Physical Exam General appearance: well developed, well nourished, no acute distress Head: normocephalic, atraumatic Eyes: conjunctivae and lids normal Pupils: equal, round, reactive to light Ears: normal, no lesions or deformities Nasal:  pale, boggy, swollen nasal turbinates Oral/Pharynx: pharyngeal erythema without exudate, uvula midline without deviation Neck: neck supple,  trachea midline, no masses Chest/Lungs: posterior expiratory wheezes heard bilateral Heart: regular rate and  rhythm, no murmur Abdomen: soft, non-tender without obvious organomegaly Extremities: normal extremities Neurological: grossly intact and non-focal Skin: no obvious rashes or lesions MSE: oriented to time, place, and person Assessment New Problems: ASTHMA UNSPECIFIED WITH EXACERBATION (ICD-493.92) ACUTE BRONCHITIS (ICD-466.0)   Patient Education: Patient and/or caregiver instructed in the following: rest. The risks, benefits and possible side effects were clearly explained and discussed with the patient.  The patient verbalized clear understanding.  The patient was given instructions to return if symptoms don't improve, worsen or new changes develop.  If it is not during clinic hours and the patient cannot get back to this clinic then the patient was told to seek medical care at an available urgent care or emergency department.  The patient verbalized understanding.   Demonstrates willingness to comply.  Plan New Medications/Changes: FLUTICASONE PROPIONATE 50 MCG/ACT SUSP (FLUTICASONE PROPIONATE) 2 sprays per nostril once daily  #1 x 0, 05/23/2010, Clanford Johnson MD, Reprinted CHERATUSSIN AC 100-10 MG/5ML SYRP (GUAIFENESIN-CODEINE) take 1 teaspoon by mouth every 6 hours as needed severe cough: Caution Will Cause Drowsiness  #70 mL x 0, 05/23/2010, Clanford Johnson MD, Reprinted PREDNISONE 20 MG TABS (PREDNISONE) take 3 tabs by mouth on day 1, then 2 tabs by mouth daily x 5 days  #11 x 0, 05/23/2010, Clanford Johnson MD, Reprinted BIAXIN XL PAC 500 MG XR24H-TAB (CLARITHROMYCIN) take 2 tabs by mouth every am with breakfast as directed  #1 x 0, 05/23/2010, Clanford Johnson MD, Reprinted FLUTICASONE PROPIONATE 50 MCG/ACT SUSP (FLUTICASONE  PROPIONATE) 2 sprays per nostril once daily  #1 x 0, 05/23/2010, Clanford Johnson MD CHERATUSSIN AC 100-10 MG/5ML SYRP (GUAIFENESIN-CODEINE) take 1 teaspoon by mouth every 6 hours as needed severe cough: Caution Will Cause Drowsiness  #70 mL x 0, 05/23/2010, Clanford Johnson MD PREDNISONE 20 MG TABS (PREDNISONE) take 3 tabs by mouth on day 1, then 2 tabs by mouth daily x 5 days  #11 x 0, 05/23/2010, Clanford Johnson MD BIAXIN XL PAC 500 MG XR24H-TAB (CLARITHROMYCIN) take 2 tabs by mouth every am with breakfast as directed  #1 x 0, 05/23/2010, Clanford Johnson MD  New Orders: Solumedrol up to 125mg  [J2930] Admin of Therapeutic Inj  intramuscular or subcutaneous [96372] Planning Comments:   Pt says she has taken prednisone before for an asthma exacerbation.  Return or go to the ER if no improvement or symptoms getting worse. The patient verbalized clear understanding.     Follow Up: Follow up in 2-3 days if no improvement, Follow up on an as needed basis, Follow up with Primary Physician  The patient and/or caregiver has been counseled thoroughly with regard to medications prescribed including dosage, schedule, interactions, rationale for use, and possible side effects and they verbalize understanding.  Diagnoses and expected course of recovery discussed and  will return if not improved as expected or if the condition worsens. Patient and/or caregiver verbalized understanding.  Prescriptions: FLUTICASONE PROPIONATE 50 MCG/ACT SUSP (FLUTICASONE PROPIONATE) 2 sprays per nostril once daily  #1 x 0   Entered and Authorized by:   Standley Dakins MD   Signed by:   Standley Dakins MD on 05/23/2010   Method used:   Reprint   RxID:   [id number] CHERATUSSIN AC 100-10 MG/5ML SYRP (GUAIFENESIN-CODEINE) take 1 teaspoon by mouth every 6 hours as needed severe cough: Caution Will Cause Drowsiness  #70 mL x 0   Entered and Authorized by:   Standley Dakins MD   Signed by:   Standley Dakins MD on  05/23/2010   Method used:   Reprint   RxID:   [id number] PREDNISONE 20 MG TABS (PREDNISONE) take 3 tabs by mouth on day 1, then 2 tabs by mouth daily x 5 days  #11 x 0   Entered and Authorized by:   Standley Dakins MD   Signed by:   Standley Dakins MD on 05/23/2010   Method used:   Reprint   RxID:   [id number] BIAXIN XL PAC 500 MG XR24H-TAB (CLARITHROMYCIN) take 2 tabs by mouth every am with breakfast as directed  #1 x 0   Entered and Authorized by:   Standley Dakins MD   Signed by:   Standley Dakins MD on 05/23/2010   Method used:   Reprint   RxID:   [id number] FLUTICASONE PROPIONATE 50 MCG/ACT SUSP (FLUTICASONE PROPIONATE) 2 sprays per nostril once daily  #1 x 0   Entered and Authorized by:   Standley Dakins MD   Signed by:   Standley Dakins MD on 05/23/2010   Method used:   Print then Give to Patient   RxID:   [id number] CHERATUSSIN AC 100-10 MG/5ML SYRP (GUAIFENESIN-CODEINE) take 1 teaspoon by mouth every 6 hours as needed severe cough: Caution Will Cause Drowsiness  #70 mL x 0   Entered and Authorized by:   Standley Dakins MD   Signed by:   Standley Dakins MD on 05/23/2010   Method used:   Print then Give to Patient   RxID:   [id number] PREDNISONE 20 MG TABS (PREDNISONE) take 3 tabs by mouth on day 1, then 2 tabs by mouth daily x 5 days  #11 x 0   Entered and Authorized by:   Standley Dakins MD   Signed by:   Standley Dakins MD on 05/23/2010   Method used:   Print then Give to Patient   RxID:   [id number] BIAXIN XL PAC 500 MG XR24H-TAB (CLARITHROMYCIN) take 2 tabs by mouth every am with breakfast as directed  #1 x 0   Entered and Authorized by:   Standley Dakins MD   Signed by:   Standley Dakins MD on 05/23/2010   Method used:   Print then Give to Patient   RxID:   [id number]   Patient Instructions: 1)  Return or go to the ER if no improvement or symptoms getting worse.   2)  Go to the pharmacy and pick up your  prescription (s).  It may take up to 30 mins for electronic prescriptions to be delivered to the pharmacy.  Please call if your pharmacy has not received your prescriptions after 30 minutes.   3)  Take your antibiotic as prescribed until ALL of it is gone, but stop if you develop a rash or swelling and contact our office as soon as possible. 4)  Acute  sinusitis symptoms for less than 10 days are not helped by antibiotics.Use warm moist compresses, and over the counter decongestants ( only as directed). Call if no improvement in 5-7 days, sooner if increasing pain, fever, or new symptoms. 5)  The patient was informed that there is no on-call provider or services available at this clinic during off-hours (when the clinic is closed).  If the patient developed a problem or concern that required immediate attention, the patient was advised to go the the nearest available urgent care or emergency department for medical care.  The patient verbalized understanding.    6)  Take yogurt or probiotics daily to prevent a yeast infection and diarrhea while on strong antibiotics.   7)  Please take extra birth control precautions while you are on antibiotics.   Medication Administration  Injection # 1:    Medication: Solumedrol up to 125mg     Diagnosis: ASTHMA UNSPECIFIED WITH EXACERBATION (ZOX-096.04)    Route: IM    Site: L deltoid    Exp Date: 10/2012    Lot #: [id number]    Mfr: pfizer    Patient tolerated injection without complications    Given by: Providence Crosby LPN (May 23, 2010 4:03 PM)  Orders Added: 1)  Solumedrol up to 125mg  [J2930] 2)  Admin of Therapeutic Inj  intramuscular or subcutaneous [96372]     Medication Administration  Injection # 1:    Medication: Solumedrol up to 125mg     Diagnosis: ASTHMA UNSPECIFIED WITH EXACERBATION [phone])    Route: IM    Site: L deltoid    Exp Date: 10/2012    Lot #: [id number]    Mfr: pfizer    Patient tolerated injection without complications     Given by: Providence Crosby LPN (May 23, 2010 4:03 PM)  Orders Added: 1)  Solumedrol up to 125mg  [J2930] 2)  Admin of Therapeutic Inj  intramuscular or subcutaneous [14782]

## 2010-07-08 ENCOUNTER — Ambulatory Visit: Payer: Self-pay | Admitting: Family Medicine

## 2010-07-08 ENCOUNTER — Ambulatory Visit (INDEPENDENT_AMBULATORY_CARE_PROVIDER_SITE_OTHER): Payer: Commercial Managed Care - PPO | Admitting: Family Medicine

## 2010-07-08 ENCOUNTER — Encounter: Payer: Self-pay | Admitting: Family Medicine

## 2010-07-08 DIAGNOSIS — J019 Acute sinusitis, unspecified: Secondary | ICD-10-CM

## 2010-07-13 NOTE — Assessment & Plan Note (Signed)
Summary: ?SINUS INFECTION/CLE  [facility]   Vital Signs:  Patient profile:   30 year old female Weight:      223 pounds Temp:     99.4 degrees F oral Pulse rate:   76 / minute Pulse rhythm:   regular BP sitting:   124 / 80  (left arm) Cuff size:   large  Vitals Entered By: Selena Batten Dance CMA Duncan Dull) (July 08, 2010 11:27 AM) CC: ? SInus infection   History of Present Illness: CC: sinus infection?  8-9d h/o nasal congestion, seems to be going into chest.  Green nasal purulent discharge, worse cough in am of brown sputum.  + myalgias.  has tried pseudophed and nyquil.  + R upper tooth pain.   No fevers/chills, abd pain, n/v/d, arthralgias, rashes.  No ear pain.  Working extra hard with [employer] transition.  also taking care of sick kids at home.  + childs sick at home.  No smokers at home.  + h/o asthma, given steroids last month.  Usually only on ventolin.  Current Medications (verified): 1)  Mirena 20 Mcg/24hr Iud (Levonorgestrel)  Allergies: 1)  ! 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   VSS Head:  Normocephalic and atraumatic without obvious abnormalities. No apparent alopecia or balding.  + maxillary R>L > frontal sinus tenderness Eyes:  No corneal or conjunctival inflammation noted. EOMI. Perrla.  Ears:  TMs clear bilaterally Nose:  nares congested Mouth:  MMM, no pharyngeal erythema Neck:  no carotid bruit or thyromegaly no cervical or supraclavicular lymphadenopathy Lungs:  Normal respiratory effort, chest expands  symmetrically. Lungs are clear to auscultation, no crackles or wheezes. Heart:  Normal rate and regular rhythm. S1 and S2 normal without gallop, murmur, click, rub or other extra sounds. Pulses:  2+ rad pulses bilaterally, brisk cap refill Extremities:  no pedal edema bilaterally   Impression & Recommendations:  Problem # 1:  SINUSITIS, ACUTE (ICD-461.9) going on 9 days, not resolving with OTC care.  treat with course of augmentin x 10 days.  red flags to return dicsussed.  supportive care as per instructions.  The following medications were removed from the medication list:    Biaxin Xl Pac 500 Mg Xr24h-tab (Clarithromycin) .Marland Kitchen... Take 2 tabs by mouth every am with breakfast as directed    Cheratussin Ac 100-10 Mg/34ml Syrp (Guaifenesin-codeine) .Marland Kitchen... Take 1 teaspoon by mouth every 6 hours as needed severe cough: caution will cause drowsiness    Fluticasone Propionate 50 Mcg/act Susp (Fluticasone propionate) .Marland Kitchen... 2 sprays per nostril once daily Her updated medication list for this problem includes:    Hydromet 5-1.5 Mg/49ml Syrp (Hydrocodone-homatropine) ..... One teaspoon at bedtime as needed cough    Augmentin 875-125 Mg Tabs (Amoxicillin-pot clavulanate) ..... One by mouth two times a day as needed 10 days  Complete Medication List: 1)  Mirena 20 Mcg/24hr Iud (Levonorgestrel) 2)  Hydromet 5-1.5 Mg/22ml Syrp (Hydrocodone-homatropine) .... One teaspoon at bedtime as needed cough 3)  Augmentin 875-125 Mg Tabs (Amoxicillin-pot clavulanate) .... One by mouth two times a day as needed 10 days  Patient Instructions:  1)  You have a sinus infection. 2)  Take medicines as prescribed: 3)  Take guaifenesin 400mg  IR 1 1/2 pills in am and at noon (or mucinex)  with plenty of fluid to help mobilize mucous. 4)  Use nasal saline spray or neti pot to help drainage of sinuses. 5)  If you start having fevers >101.5, trouble swallowing or breathing, or are worsening instead of improving as expected, you may  need to be seen again. 6)  Good to see you today, call clinic with questions.  Prescriptions: AUGMENTIN 875-125 MG TABS (AMOXICILLIN-POT CLAVULANATE) one by mouth two times a day as needed 10 days  #20 x 0   Entered and Authorized by:   Eustaquio Boyden  MD   Signed by:   Eustaquio Boyden  MD on 07/08/2010   Method used:   Electronically to        [facility]* (retail)       [address]       [address] Shipping/mailing       [place], [place]  [address]       Ph: [phone]       Fax: [phone]   RxID:   [id number] HYDROMET 5-1.5 MG/5ML SYRP (HYDROCODONE-HOMATROPINE) one teaspoon at bedtime as needed cough  #100cc x 0   Entered and Authorized by:   Eustaquio Boyden  MD   Signed by:   Eustaquio Boyden  MD on 07/08/2010   Method used:   Print then Give to Patient   RxID:   [id number]    Orders Added: 1)  Est. Patient Level III [64403]    Current Allergies (reviewed today): ! [facility]

## 2010-07-20 ENCOUNTER — Encounter: Payer: Self-pay | Admitting: Family Medicine

## 2010-07-25 ENCOUNTER — Encounter: Payer: Self-pay | Admitting: Family Medicine

## 2010-07-25 ENCOUNTER — Ambulatory Visit (INDEPENDENT_AMBULATORY_CARE_PROVIDER_SITE_OTHER): Payer: Commercial Managed Care - PPO | Admitting: Family Medicine

## 2010-07-25 DIAGNOSIS — J328 Other chronic sinusitis: Secondary | ICD-10-CM | POA: Insufficient documentation

## 2010-07-25 DIAGNOSIS — R05 Cough: Secondary | ICD-10-CM

## 2010-08-02 NOTE — Letter (Signed)
Summary: [facility]   [facility]   Imported By: Kassie Mends 07/28/2010 11:41:18  _____________________________________________________________________  External Attachment:    Type:   Image     Comment:   External Document

## 2010-08-02 NOTE — Assessment & Plan Note (Signed)
Summary: fever, sore throat, cough   Vital Signs:  Patient profile:   30 year old female Height:      66.25 inches Weight:      219.75 pounds BMI:     35.33 Temp:     100.1 degrees F oral Pulse rate:   76 / minute Pulse rhythm:   regular BP sitting:   126 / 90  (right arm) Cuff size:   large  Vitals Entered By: [name] CMA Duncan Dull) (July 25, 2010 3:11 PM)  History of Present Illness: Chief complaint fever, sore throat and cough  30 year old female:  103 fever last night. Has had motrin  Coughing a lot and chest not tight. no wheezing. Pressure behind eyes.  tx with augmentin 2 weeks ago by Dr. Reece Agar, now with return of symptoms --- overall > 1 mo.  Acute Visit History:      The patient complains of cough, fever, headache, nasal discharge, sinus problems, and sore throat.  These symptoms began 4 weeks ago.  She denies abdominal pain and chest pain.        Her highest temperature has been 103.  This temperature was recorded last evening.  The fever has improved with ibuprofen.        The cough interferes with her sleep.  There is no history of wheezing, shortness of breath, respiratory retractions, tachypnea, cyanosis, or interference with oral intake associated with her cough.        She complains of sinus pressure, teeth aching, ears being blocked, nasal congestion, purulent drainage, and frontal headache.  The patient has had a past history of sinusitis and sinusitis in the last 2 months.        Urine output has been normal.  She is tolerating clear liquids.        Allergies: 1)  ! Keflex  Past History:  Past medical, surgical, family and social histories (including risk factors) reviewed, and no changes noted (except as noted below).  Past Medical History: Reviewed history from 05/23/2010 and no changes required. Asthma History of Kidney Stones  Past Surgical History: Reviewed history from 08/19/2008 and no changes required. kidney stone removal, right  nephrostomay tube 2008, 2009 NSVD x 3  Family History: Reviewed history from 08/19/2008 and no changes required. father:DM, HTN mother: healthy only child CAD both grandparents,  PGF: DM, MI age 90 MGF: age 83 CABG aunt breast cancer  Social History: Reviewed history from 05/23/2010 and no changes required. 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 CV: No chest pain or SOB GI: No noted N or V Otherwise, pertinent positives and negatives are noted in the HPI.   Physical Exam  Additional Exam:  Gen: WDWN, NAD; alert,appropriate and cooperative throughout exam  HEENT: Normocephalic and atraumatic. Throat clear, w/o exudate, no LAD, R TM clear, L TM - good landmarks, No fluid present. rhinnorhea.  Left frontal and maxillary sinuses: Tender Right frontal and maxillary sinuses: Tender  Neck: No ant or post LAD  CV: RRR, No M/G/R  Pulm: Breathing comfortably in no resp distress. no w/c/r  Abd: S,NT,ND,+BS  Extr: no c/c/e  Psych: full affect, pleasant    Impression & Recommendations:  Problem # 1:  OTHER CHRONIC  SINUSITIS (ICD-473.8) Assessment New  treatment failure sinusitis.   The following medications were removed from the medication list:    Hydromet 5-1.5 Mg/63ml Syrp (Hydrocodone-homatropine) ..... One teaspoon at bedtime as needed cough    Augmentin 875-125 Mg Tabs (Amoxicillin-pot clavulanate) ..... One by mouth two times a day as needed 10 days Her updated medication list for this problem includes:    Levaquin 500 Mg Tabs (Levofloxacin) .Marland Kitchen... Take 1 tab by mouth daily    Hydrocodone-homatropine 5-1.5 Mg Tabs (Hydrocodone-homatropine) .Marland Kitchen... 1 - 2  tsp by mouth qhs  Orders: Rapid Strep  (44034)  Problem # 2:  COUGH (ICD-786.2) Assessment: New hycodan as needed  sudafed  Complete Medication List: 1)  Mirena 20 Mcg/24hr Iud (Levonorgestrel) 2)  Levaquin 500 Mg Tabs (Levofloxacin) .... Take 1 tab by mouth daily 3)  Hydrocodone-homatropine 5-1.5 Mg Tabs (Hydrocodone-homatropine) .Marland Kitchen.. 1 - 2  tsp by mouth qhs Prescriptions: HYDROCODONE-HOMATROPINE 5-1.5 MG TABS (HYDROCODONE-HOMATROPINE) 1 - 2  tsp by mouth qhs  #8 oz x 0   Entered and Authorized by:   Hannah Beat MD   Signed by:   Hannah Beat MD on 07/25/2010   Method used:   Print then Give to Patient   RxID:   [id number] LEVAQUIN 500 MG TABS (LEVOFLOXACIN) Take 1 tab by mouth daily  #14 x 0   Entered and Authorized by:   Hannah Beat MD   Signed by:   Hannah Beat MD on 07/25/2010   Method used:   Electronically to        [facility]* (retail)       [address]       [address] Shipping/mailing       [place], [place]  [address]       Ph: [phone]       Fax: [phone]   RxID:   [id number]    Orders Added: 1)  Rapid Strep [83151] 2)  Est. Patient Level IV [76160]    Current Allergies (reviewed today): ! [facility]

## 2010-09-05 LAB — URINE MICROSCOPIC-ADD ON

## 2010-09-05 LAB — CBC
HCT: 33.4 % — ABNORMAL LOW (ref 36.0–46.0)
Hemoglobin: 11.5 g/dL — ABNORMAL LOW (ref 12.0–15.0)
Hemoglobin: 13 g/dL (ref 12.0–15.0)
MCV: 92.7 fL (ref 78.0–100.0)
RBC: 3.6 MIL/uL — ABNORMAL LOW (ref 3.87–5.11)
RBC: 4.17 MIL/uL (ref 3.87–5.11)
RDW: 13.3 % (ref 11.5–15.5)
WBC: 12.8 10*3/uL — ABNORMAL HIGH (ref 4.0–10.5)
WBC: 15.7 10*3/uL — ABNORMAL HIGH (ref 4.0–10.5)

## 2010-09-05 LAB — URINE CULTURE

## 2010-09-05 LAB — URINALYSIS, ROUTINE W REFLEX MICROSCOPIC
Bilirubin Urine: NEGATIVE
Glucose, UA: NEGATIVE mg/dL
Nitrite: NEGATIVE
Specific Gravity, Urine: 1.006 (ref 1.005–1.030)
pH: 6 (ref 5.0–8.0)

## 2010-09-05 LAB — PREGNANCY, URINE: Preg Test, Ur: NEGATIVE

## 2010-09-06 LAB — URINALYSIS, ROUTINE W REFLEX MICROSCOPIC
Glucose, UA: NEGATIVE mg/dL
Leukocytes, UA: NEGATIVE
pH: 5.5 (ref 5.0–8.0)

## 2010-09-06 LAB — URINE MICROSCOPIC-ADD ON

## 2010-09-06 LAB — URINE CULTURE

## 2010-09-20 DIAGNOSIS — N12 Tubulo-interstitial nephritis, not specified as acute or chronic: Secondary | ICD-10-CM | POA: Insufficient documentation

## 2010-10-04 NOTE — Consult Note (Signed)
NAMEDALPHINE, COWIE NO.:  [id number]   MEDICAL RECORD NO.:  [id number]          PATIENT TYPE:  OUT   LOCATION:  XRAY                         FACILITY:  [facility]   PHYSICIAN:  Courtney Paris, M.D.DATE OF BIRTH:  05/09/1981   DATE OF CONSULTATION:  01/13/2008  DATE OF DISCHARGE:                                 CONSULTATION   REFERRING PHYSICIAN:  Lenoard Aden, M.D.   REASON FOR CONSULTATION:  Right hydronephrosis with recent placement of  nephrostomy tube and intrauterine pregnancy.   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  I saw her on  December 23, 2007.  She was having a little bit of vague right-sided pain,  but it was not severe.  There was a KUB done on July 22, 2007, the  distal stone she had previously was gone.  There was no stone in the  left kidney, but on the right kidney, there was some overlying gas that  made stones impossible to see.  She delivered last June and then had  ureteroscopy in September on the right up to a persistent stone that I  thought she had it passed, but there was a concentric strictured area  that seemed to hold up the stone.  The stone was removed.  She had a  stent that she did not tolerate all that well afterwards and then  followup ultrasounds did not show any resultant hydronephrosis.  She  does have an extrarenal pelvis on the right, which is normal.  She had a  nephrostomy today because of worsening hydronephrosis, which I think was  certainly appropriate.  She is feeling a little bit better.  The urine  is draining clear from the right nephrostomy tube.   The rest of physical, past medical history, social history, and review  of systems are as noted on the chart.   IMPRESSION:  1. Right hydronephrosis.  2. Possible distal left ureteral stricture with stones.  3. Intrauterine pregnancy of 19 weeks.   RECOMMENDATIONS:  Just leaving the nephrostomy tube for now.  She can  change the dressing  daily as needed, but I think this would be the  safest way to proceed from here.  To put a distal stent, we would  require more fluoro time, and I think it would be safe unless absolutely  necessary.  She also does tolerate stents very well and I think will  tolerate this better than the stent.  Hopefully, she can go 6 or  possibly 8 weeks before this has changed.  She could have a lead apron  over the baby when this is changed and I think they have a minimum of  fluoro exposure to the baby.  Hopefully, she can get through this  pregnancy with having this changed may be 1 or possibly 2 times.  We  would not give her prophylactic antibiotics to cover the instrumentation  just for now.  She will drink a lot of fluids to keep this draining  well, and if it starts filling up or not draining, then it needs to be  changed at that time.      Courtney Paris, M.D.  Electronically Signed     HMK/MEDQ  D:  01/13/2008  T:  01/14/2008  Job:  [id number]

## 2010-10-04 NOTE — H&P (Signed)
[name], BARBEE NO.:  [id number]   MEDICAL RECORD NO.:  [id number]          PATIENT TYPE:  OBV   LOCATION:  9306                          FACILITY:  [facility]   PHYSICIAN:  Lenoard Aden, M.D.DATE OF BIRTH:  14-Feb-1981   DATE OF ADMISSION:  03/05/2008  DATE OF DISCHARGE:                              HISTORY & PHYSICAL   CHIEF COMPLAINT:  Fever and leukocytosis.   She is a 30 year old white female G3, P2 with a history of known  complicated urolithiasis and multiple stones in her right genitourinary  system who is status post nephrostomy placement during this pregnancy in  August 2009.  Now, with recurrent Klebsiella UTI, low-grade fever, and  leukocytosis, status post treatment with Augmentin, status post  treatment with Bactrim, who is admitted for nonresponse to p.o.  antibiotics for IV antibiotic therapy.   ALLERGIES:  DYE and KEFLEX PILLS.   MEDICATIONS:  Prenatal vitamins and Percocet as needed.   PAST MEDICAL HISTORY:  Remarkable for:  1. History of pyelonephritis.  2. Hydronephrosis.  3. Group B strep with previous pregnancy.  4. Intermittent asthma.  5. Postpartum depression.  6. Urolithiasis as noted.   She is a nonsmoker, nondrinker.  She denies domestic physical violence.   She has family history of rheumatoid arthritis, migraine headaches,  diabetes, COPD, myocardial infarction and heart disease.   She has had previously 2 uncomplicated vaginal deliveries and her  prenatal course to date has been uncomplicated, is currently at 26-2/7th  weeks' gestation.   PHYSICAL EXAMINATION:  GENERAL:  She is a comfortable-appearing white  female in no acute distress.  VITAL SIGNS:  Stable.  The patient is afebrile.  HEENT:  Normal.  LUNGS:  Clear.  NECK:  Supple with full range of motion.  HEART:  Regular rate and rhythm.  ABDOMEN:  Soft, gravid, nontender.  Back:  An intact nephrostomy site with some localized erythema and an  intact  nephrostomy bag.  PELVIC:  Deferred.  EXTREMITIES:  There are no cords.  NEUROLOGIC: Nonfocal.  SKIN:  Intact.   Urine C&S to be drawn.  CBC pending.   IMPRESSION:  1. A 26-2/7th-week intrauterine pregnancy.  2. History of complicated urolithiasis, status post nephrostomy with      recurrent refractory urinary tract infection, leukocytosis, and low-      grade fever.   PLAN:  We will admit for IV antibiotic therapy with Ancef and  gentamicin.  Obtain Urology consult.  Check fetal heart tones every  shift.  We will monitor with toco 1 hour every shift, and we will  discharge upon clinical response to IV antibiotics.      Lenoard Aden, M.D.  Electronically Signed     RJT/MEDQ  D:  03/05/2008  T:  03/06/2008  Job:  [id number]

## 2010-10-04 NOTE — H&P (Signed)
[name], BRANDENBURGER                ACCOUNT NO.:  [id number]   MEDICAL RECORD NO.:  [id number]          PATIENT TYPE:  INP   LOCATION:  9306                          FACILITY:  [facility]   PHYSICIAN:  Lenoard Aden, M.D.DATE OF BIRTH:  1981-03-15   DATE OF ADMISSION:  01/11/2008  DATE OF DISCHARGE:                              HISTORY & PHYSICAL   CHIEF COMPLAINT:  Flank pain.   HISTORY OF PRESENT ILLNESS:  She is a 30 year old white female G3, P2  with a history of vaginal delivery x2, known urolithiasis, and multiple  stones on the right being followed by Dr. Aldean Ast of [facility]  who presents with worsening right flank pain times that lasts 24 hours,  unrelieved by Percocet use.  She presents now for pain relief and  followup.  She is on no new drug.   ALLERGIES:  She is allergic to Keflex.   MEDICATIONS:  Prenatal vitamins, Macrobid, and Percocet p.r.n.   PAST MEDICAL HISTORY:  She has a history of pyelonephritis,  hydronephrosis, history of group B strep with previous pregnancy,  history of intermittent asthma, history of postpartum depression, and  urolithiasis as noted.   SOCIAL HISTORY:  She is a nonsmoker and nondrinker.  She denies domestic  or physical violence.   FAMILY HISTORY:  She has a family history of rheumatoid arthritis,  migraine headaches, diabetes, COPD, myocardial infarction, and heart  disease.   Pregnancy was remarkable for 2 uncomplicated vaginal deliveries,  prenatal course date complicated only by right flank pain, currently at  18-4/7th weeks' gestation.   PHYSICAL EXAMINATION:  GENERAL:  She is an uncomfortable-appearing white  female, in no acute distress.  VITAL SIGNS:  Blood pressure 101/62, pulse of 80, respirations 20, and  fetal heart tones in the 160 beat per minute range.  HEENT:  Normal.  NECK:  Supple.  Full range of motion.  LUNGS:  Clear.  HEART:  Regular rhythm.  ABDOMEN:  Soft, gravid, and nontender  BACK:  There is no  CVA tenderness.  There is tenderness along the right  flank, which proceeds towards her hip area.  PELVIC:  Deferred.  EXTREMITIES:  No cords.  NEUROLOGIC:  Nonfocal.   LABORATORY DATA:  Urinalysis is obtained, it is a clean catch and is  negative.  She was given Dilaudid IM with moderate relief.  Complete  metabolic profile were remarkable only for a potassium of 3.4.  Ultrasound reports severe right hydronephrosis and questionably mild  left hydronephrosis.   IMPRESSION:  1. An 18-4/7th week intrauterine pregnancy.  2. Severe worsening right flank pain with now severe right      hydronephrosis, questionably progressive from previous scan in the      Urology Office.   PLAN:  Continue with Macrobid suppression.  Start Dilaudid PCA IV  fluids.  Urology consult will admit at this time for pain management.      Lenoard Aden, M.D.  Electronically Signed     RJT/MEDQ  D:  01/11/2008  T:  01/12/2008  Job:  [id number]

## 2010-10-04 NOTE — Op Note (Signed)
[name], [name]                ACCOUNT NO.:  [id number]   MEDICAL RECORD NO.:  [id number]          PATIENT TYPE:  AMB   LOCATION:  NESC                         FACILITY:  [facility]   PHYSICIAN:  Courtney Paris, M.D.DATE OF BIRTH:  08/30/1980   DATE OF PROCEDURE:  06/19/2007  DATE OF DISCHARGE:                               OPERATIVE REPORT   PREOPERATIVE DIAGNOSIS:  Right distal ureteral stone, right distal  ureteral stricture.   POSTOPERATIVE DIAGNOSIS:  Right distal ureteral stone, right distal  ureteral stricture.   OPERATION:  Cystoscopy, right retrograde pyelogram, right ureteroscopy,  dilation of ureteral stricture, removal of stone, and insertion of  stent.   ANESTHESIA:  General.   SURGEON:  Courtney Paris, M.D.   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  She had her second  baby in June 2008, had some trouble with the stone during her pregnancy,  had ureteroscopy in August for non-passage of the stone from the distal  right ureter.  She had a mildly concentric strictured area of the distal  right ureter just distal to the stone at that time. She began having  pain again on June 03, 2007, she has been unable to work all but just  a handful of days since then because of persistent stone in the right  distal ureter that measures 6-7 mm, this was up in the right kidney  prior to January.  She enters now to have the stone removed at this  time.   DESCRIPTION OF PROCEDURE:  The patient was placed on the operating table  in the dorsal lithotomy position. After satisfactory induction of  general anesthesia, was prepped and draped in the usual sterile  technique and the panendoscope was inserted.  She was given IV Cipro.  The bladder was inspected and found to be free of any mucosal lesions.  The left orifice looked normal.  The right was somewhat edematous as it  was before. I took  pictures of this. I had to put a Sensor guidewire  through a 5 open ended ureteral catheter to be able to catheterize the  orifice because of the edema.  I then removed the guidewire.  I then  performed an occlusive retrograde.  This showed a small stricture about  2-3 cm from the UV junction just proximal to the stone.  This was  probably why it did not pass similar to what was seen last August.  The  rest of the kidney looked normal except there was hydronephrosis all the  way up to and including the kidney.  There were a few air bubbles  present, as well.   Through the open ended catheter, I then passed the Sensor guidewire back  up to the kidney and removed the open ended catheter.  I then gently  dilated the ureter with a ureteral access sheath under fluoroscopy.  This was done to and just past the stone.  It was done very gently and  seemed to dilate easily.  Leaving the  guidewire in place, I then used a  6 short ureteroscope passed along the guidewire up to the stone where it  was photographed and the position in the ureter was identified with an x-  ray.  The stone was grasped with a basket and then removed intact.  It  came through the distal ureter a little tightly but intact.  I then  passed a 6 French x 24 cm double-J ureteral stent up the  ureter over the guidewire under fluoroscopy, removed the guidewire, and  the stent was in good position.  Again photographs were made, the  bladder drained, the scope removed, and the patient taken to the  recovery area in good condition to be later discharged as an outpatient,  come back in a week to have the stent removed at that time.      Courtney Paris, M.D.  Electronically Signed     HMK/MEDQ  D:  06/19/2007  T:  06/19/2007  Job:  [id number]

## 2010-10-04 NOTE — H&P (Signed)
NAMEJUAN, OLTHOFF NO.:  [id number]   MEDICAL RECORD NO.:  [id number]          PATIENT TYPE:  OBV   LOCATION:  9308                          FACILITY:  [facility]   PHYSICIAN:  Lenoard Aden, M.D.DATE OF BIRTH:  11-22-1980   DATE OF ADMISSION:  04/20/2008  DATE OF DISCHARGE:                              HISTORY & PHYSICAL   CHIEF COMPLAINT:  A 32-week intrauterine pregnancy with complicated  nephrostomy.  Urolithiasis and new onset Pseudomonas infection for IV  antibiotic therapy.   HISTORY OF PRESENT ILLNESS:  The patient is 30 year old white female G3,  P2, with complicated urolithiasis, status post nephrostomy placement  with replacement of nephrostomy tube and recurrent urinary tract  infection.  Previously treated for Klebsiella, now with Pseudomonas  aeruginosa infection.  She has allergies questionable to cephalosporin  or Keflex dye and p.o. capsule, not IV.  She is a nonsmoker, nondrinker.  Denies domestic physical violence, past medical history of asthma, and  von Willebrand disease, and postpartum depression.   FAMILY HISTORY:  Rheumatoid arthritis, migraine headaches, diabetes,  COPD, myocardial infarction, heart disease, history of 2 uncomplicated  vaginal deliveries, prenatal course otherwise uncomplicated.   PHYSICAL EXAMINATION:  GENERAL:  She is a well-developed, well-nourished  white female in no acute distress.  HEENT:  Normal.  LUNGS:  Clear.  HEART:  Regular rate and rhythm.  ABDOMEN:  Soft, gravid, and nontender.  No CVA tenderness.  Nephrostomy  tube intact.  PELVIC:  Deferred.  EXTREMITIES:  There are no cords.  NEUROLOGIC:  Nonfocal.  SKIN:  Intact.  NST is reactive.   IMPRESSION:  Complicated urolithiasis with now new-onset Pseudomonas  infection to 32 week OB.   PLAN:  IV protocol.   RECOMMENDATIONS:  Urology will administer initial IV antibiotic doses  here overnight for 24-hour observation and discharge home in the  morning  for continuation of 7 days of IV gentamicin per protocol per Urology for  Pseudomonas infection.      Lenoard Aden, M.D.  Electronically Signed     RJT/MEDQ  D:  04/20/2008  T:  04/21/2008  Job:  [id number]

## 2010-10-04 NOTE — Op Note (Signed)
NAMEMARLYSS, [name]                ACCOUNT NO.:  [id number]   MEDICAL RECORD NO.:  [id number]          PATIENT TYPE:  AMB   LOCATION:  DAY                          FACILITY:  [facility]   PHYSICIAN:  Courtney Paris, M.D.DATE OF BIRTH:  January 02, 1981   DATE OF PROCEDURE:  01/02/2007  DATE OF DISCHARGE:  01/02/2007                               OPERATIVE REPORT   PREOPERATIVE DIAGNOSIS:  Right distal ureteral stone.   POSTOPERATIVE DIAGNOSES:  Right distal ureteral stone.   OPERATION:  Cystoscopy, right retrograde pyelogram, right ureteroscopy  with stone removal, right ureteral stent.   ANESTHESIA:  General.   SURGEON:  Courtney Paris, M.D.   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  She had pain prior to delivery on the right side with right  hydronephrosis and after she delivered was found to have a 7 mm stone in  the distal right ureter.  It has not progressed since then.  She enters  now to have this removed at this time.  She also has von Willebrand's  disease.  She had never had previous stones but has two stones in her  right kidney and has had hydronephrosis since 01/19/2007 when it was  first discovered.   DESCRIPTION OF PROCEDURE:  The patient was placed on the operating table  in the dorsal lithotomy position, prepped and draped with Betadine in  the usual sterile fashion.  Given IV Cipro.  Time-out was performed and  the laterality of the patient was marked and checked.  The cystoscope  was inserted and the bladder carefully inspected.  It was free of any  mucosal lesions.  The right orifice seemed a little swollen but a #5  open-ended ureteral catheter could be inserted and an occlusive  retrograde was performed.  The ureter was dilated proximal to the stone  which was about 3 cm from the UV junction on the right.  The ureter was  quite tortuous in its proximal portion, probably from the chronic hydro.  There seemed to be a  filling defect in the lower pole of the right  kidney as well.  This was about a centimeter across, it could be another  stone.   Through the open-ended catheter a guidewire was then passed under  fluoroscopy up to the level of the kidney.  It was a little tortuous and  I had to negotiate the tip of the guidewire up into the kidney and then  remove the open-ended ureteral catheter.  Over the guidewire I then  dilated the distal ureter with a 12-14 French ureteral access sheath  cannula also under fluoroscopy.  When this was done, leaving the  guidewire in place, a 6 short ureteroscope was then passed under direct  vision up the ureter to the stone which was able to be negotiated past.  I was able to get a basket on the stone but when I pulled it out it  seemed to break apart and I had to make two more passes with  ureteroscope to remove two more small  fragments.  The fourth pass showed  that there was no further stone, although there was a little bit of  edema noted, I decided to leave a ureteral catheter stent.  A small  piece of stone was sent for analysis.  Over the guidewire I then loaded  the cystoscope and passed a 6-French x 24 cm length double-J ureteral  stent up to level of the kidney as the guidewire was removed a nice coil  in the renal pelvis and one in the bladder.  A  string was left on the distal end, brought out through the urethra and  taped to the lower abdomen.  The patient was then taken to recovery room  in good condition, will be later discharged as an outpatient.  Will have  her come back in 48 hours and will remove the stent at that time.      Courtney Paris, M.D.  Electronically Signed     HMK/MEDQ  D:  01/02/2007  T:  01/03/2007  Job:  [id number]

## 2010-10-04 NOTE — H&P (Signed)
[name], [name] NO.:  [id number]   MEDICAL RECORD NO.:  [id number]          PATIENT TYPE:  MAT   LOCATION:  MATC                          FACILITY:  [facility]   PHYSICIAN:  Lenoard Aden, M.D.DATE OF BIRTH:  11-08-1980   DATE OF ADMISSION:  03/14/2008  DATE OF DISCHARGE:                              HISTORY & PHYSICAL   CHIEF COMPLAINT:  Right flank pain.   She is a 30 year old white female G3, P2 at 68 and 4/7th weeks'  gestation with known complicated urolithiasis status post right  nephrostomy placement in August 2009 who presents now with right flank  pain worsening over the last 3 nights.  She is a nonsmoker, nondrinker.  She denies domestic or physical violence.   Her medications include prenatal vitamins and Percocet p.r.n.   She has a history of 2 uncomplicated vaginal deliveries, history of a  complicated urolithiasis as noted with nephrostomy as noted.   Allergies are to [facility], DYE only.   She has a family history remarkable for rheumatoid arthritis, migraine  headaches, diabetes, COPD, myocardial infarction, heart disease.   PHYSICAL EXAMINATION:  GENERAL:  She is a well-developed, well-nourished  white female in no acute distress.  HEENT:  Normal.  LUNGS:  Clear.  HEART:  Regular rhythm.  ABDOMEN:  Soft, gravid, and nontender.  CERVICAL:  Deferred.  EXTREMITIES:  No cords.  NEUROLOGIC:  Nonfocal.  SKIN:  Intact.  The 2 arthrostomy ports were inspected and found to be  intact.  There is a small amount of pus drainage from the right superior  nephrostomy site.   Urine cultures taken, CBC and CMP are normal.  Amylase, lipase pending.   IMPRESSION:  Her nonstress test is reassuring.  No contractions were  noted.  Cervical exam as noted was deferred.   IMPRESSION:  1. 27 and 4/7th week during pregnancy.  2. Complicated urolithiasis status post nephrostomy with persistent      right flank discomfort, normal gallbladder ultrasound,  normal      laboratory work today.   PLAN:  Will be discharged home.  Follow up with Urology as noted.  Aggressive with Percocet for management 1-2 p.o. every 4-6 and follow up  in the office as scheduled.      Lenoard Aden, M.D.  Electronically Signed     RJT/MEDQ  D:  03/14/2008  T:  03/14/2008  Job:  [id number]

## 2010-10-04 NOTE — H&P (Signed)
[name], Yolanda [name]                ACCOUNT NO.:  [id number]   MEDICAL RECORD NO.:  [id number]          PATIENT TYPE:  INP   LOCATION:  9103                          FACILITY:  [facility]   PHYSICIAN:  Lenoard Aden, M.D.DATE OF BIRTH:  03-May-1981   DATE OF ADMISSION:  05/31/2008  DATE OF DISCHARGE:                              HISTORY & PHYSICAL   CHIEF COMPLAINT:  Induction.   HISTORY OF PRESENT ILLNESS:  [name] 30 year old white female G3, P2 at 91  weeks' gestation with complicated urolithiasis, status post nephrostomy  tube placement with recurrent infections for indicated induction at 39  weeks.  She has [name] history of borderline amniocentesis at 67 weeks' with  an LS/PG ratio greater than 2, PG negative, now at 39 weeks for  induction.   PAST MEDICAL HISTORY:  Urolithiasis as noted, nephrostomy placement as  noted, hydronephrosis necessitating nephrostomy tube as noted, mild Von  Willebrand disease with normal workup, and monitoring this pregnancy.   ALLERGIES:  She has [name] history of allergies to [facility], which only implies  allergy to the dye in the pills, not in the medication.   FAMILY HISTORY:  Rheumatoid arthritis, migraines, diabetes, COPD,  myocardial infarction, and heart disease.   PERSONAL HISTORY:  Two uncomplicated vaginal deliveries, 1 at 38 weeks  and 1 at 39 weeks.   PAST SURGICAL HISTORY:  Also remarkable for [name] tonsillectomy.   MEDICATIONS:  1. Dilaudid as needed for pain from nephrostomy tube placement.  2. Prenatal vitamins.  3. Phenergan as needed for nausea.   PHYSICAL EXAMINATION:  GENERAL:  She is [name] well-developed, well-nourished  white female in no acute distress.  HEENT:  Normal.  LUNGS:  Clear.  HEART:  Regular rhythm.  ABDOMEN:  Soft, gravid, and nontender.  No CVA tenderness.  Nephrostomy  tube site is intact.  EXTREMITIES:  No cords.  NEUROLOGIC:  Nonfocal.  SKIN:  Intact.  PELVIC:  [name] 2-3 cm, 90%, and vertex -1.   IMPRESSION:  1. [name] 39-week  OB.  2. History of complicated urolithiasis with multiple site infections,      treated intermittently with antibiotics.  3. History of mild Von Willebrand disease with normal platelet counts      and normal lab workup as noted.   PLAN:  We will admit,  Pitocin, epidural as needed.  Follow up with  Urology.  Postpartum, anticipated attempts at vaginal delivery  difficulty.      Lenoard Aden, M.D.  Electronically Signed     RJT/MEDQ  D:  05/31/2008  T:  05/31/2008  Job:  [id number]

## 2010-10-07 NOTE — Discharge Summary (Signed)
[name], PELTO NO.:  [id number]   MEDICAL RECORD NO.:  [id number]          PATIENT TYPE:  INP   LOCATION:  9306                          FACILITY:  [facility]   PHYSICIAN:  Lenoard Aden, M.D.DATE OF BIRTH:  01-Dec-1980   DATE OF ADMISSION:  01/11/2008  DATE OF DISCHARGE:  01/16/2008                               DISCHARGE SUMMARY   The patient was admitted on January 11, 2008, with symptomatic renal  colic and right urolithiasis.  She was seen by Urology and  Interventional Radiology, had a stent placed.  Postprocedure course was  uncomplicated.  She was then discharged to home on hospital day #4 with  adequate pain relief.  Discharge teaching done.  Discharge medications  given.  Follow up in the office within 1 week.  Percocet 5, #30 given.  Follow up with Urology scheduled.      Lenoard Aden, M.D.  Electronically Signed     RJT/MEDQ  D:  02/12/2008  T:  02/13/2008  Job:  [id number]

## 2010-10-07 NOTE — Discharge Summary (Signed)
NAMESHEYANN, [name]                ACCOUNT NO.:  [id number]   MEDICAL RECORD NO.:  [id number]          PATIENT TYPE:  INP   LOCATION:  9305                          FACILITY:  [facility]   PHYSICIAN:  Richardean Sale, M.D.   DATE OF BIRTH:  [birth year]   DATE OF ADMISSION:  03/15/2005  DATE OF DISCHARGE:  03/18/2005                                 DISCHARGE SUMMARY   ADMITTING DIAGNOSIS:  Fever, probable pyelonephritis.   DISCHARGE DIAGNOSIS:  Fever, probable pyelonephritis.   CONSULTATIONS:  Dr. Aldean Ast with Urology.   STUDIES:  A CT scan, renal ultrasound.   HOSPITAL COURSE AND HISTORY OF PRESENT ILLNESS:  This is a 30 year old  gravida 1, para 1 white female, who presented on postpartum day #6, status  post an uncomplicated vaginal delivery with significant fever up to 102.5  and back pain.  The patient had a history of pyelonephritis and multiple  urinary tract infections as a child.  The patient, on admission, was found  to have a markedly elevated white count at 22.9 thousand.  The patient was  admitted and started on intravenous antibiotics.  A renal ultrasound showed  mild right hydronephrosis with no perinephric abscess, or any other  abnormalities.  The patient continued to have fevers and significant  abdominal pain.  She underwent CT of abdomen and pelvis which showed  worsening right hydronephrosis, but no obvious obstruction or ureteral  stone.  Urology was consulted for evaluation and management.  She was  switched to oral antibiotics and she remained afebrile over 24 hours prior  to discharge.  She was subsequently discharge to home on hospital day #4 in  improved condition.   DISPOSITION:  To home.   CONDITION:  Improved.  1.  Macrobid 100 mg p.o. b.i.d. for a total of 7-day course.  2.  Ibuprofen as needed.  3.  Tylenol as needed.   FOLLOW UP:  The patient will follow-up with Dr. Aldean Ast with Urology  within one week and at [facility] in four to six weeks  for a routine  postpartum visit.   LABORATORY STUDIES:  White blood cell count on admission was 22,000.  On  discharge, 9.8 thousand.  A hemoglobin of 13.8, platelet count 304.  Urine  cultures and blood cultures showed growth.  A renal ultrasound on  March 16, 2005 showed right hydronephrosis.  Chest x-ray on March 16, 2005 was negative for acute pulmonary disease or cardiopulmonary disease.  CAT scan showed right hydronephrosis on March 17, 2005 and ultrasound on  March 18, 2005 showed improvement in right hydronephrosis.      Richardean Sale, M.D.  Electronically Signed     JW/MEDQ  D:  05/10/2005  T:  05/10/2005  Job:  [id number]

## 2010-10-07 NOTE — H&P (Signed)
NAMEMALLORIE, NORROD NO.:  [id number]   MEDICAL RECORD NO.:  [id number]          PATIENT TYPE:  INP   LOCATION:  9162                          FACILITY:  [facility]   PHYSICIAN:  Genia Del, M.D.DATE OF BIRTH:  [birth year]   DATE OF ADMISSION:  03/09/2005  DATE OF DISCHARGE:                                HISTORY & PHYSICAL   HISTORY AND PHYSICAL:  Ms. Tse is a 30 year old G1, 38 weeks 5 days'  gestation, expected date of delivery March 18, 2005.   REASON FOR ADMISSION:  Spontaneous rupture of membranes with clear fluid  around 4 a.m. with also increasing uterine contractions.   HISTORY OF PRESENT ILLNESS:  Clear fluid leak around 4 with increasing  uterine contractions about every five minutes.  No vaginal bleeding.  Fetal  movement is positive.  No PIH symptoms.   PAST MEDICAL HISTORY:  Remote cystitis and pyelonephritis.  History of  situational depression.   PAST SURGICAL HISTORY:  Tonsillectomy in 1993 and then complications with  blood clots, removed two weeks later.   ALLERGIES:  KEFLEX, rash.   CURRENT MEDICATIONS:  Prenate vitamins.   FAMILY HISTORY:  Diabetes mellitus.   HISTORY OF PRESENT PREGNANCY:  Blood work O positive.  Rubella immune.  STD  workup negative.  The patient had a workup for Von Willebrand's because of a  possible past history but all results were within normal limits.  Quad test  was within normal limits.  Ultrasound review of anatomy was within normal  limits.  Placenta was previa at first, which resolved.  One-hour GTT within  normal limits at 119 and group B strep positive at 35 weeks.  Blood pressure  has remained normal.  Uterine height corresponded well.   REVIEW OF SYSTEMS:  Negative.   PHYSICAL EXAMINATION:  GENERAL:  No apparent distress.  VITAL SIGNS:  Blood pressure 136/90, pulse 72, respiratory rate 20,  temperature 97.8.  O2 saturation 99%.  CHEST:  Lungs clear.  CARDIAC:  Regular cardiac rhythm.  ABDOMEN:  Gravid uterus, cephalic presentation.  PELVIC:  Vaginal exam on admission:  Clear fluid present.  Cervix 1+ cm,  cephalic presentation.  EXTREMITIES:  Lower limbs normal.  MONITORING:  Fetal heart rate reactive, 140s, no decelerations.  Mild  uterine contractions about every five minutes.   IMPRESSION:  Gravida 1, 38+ weeks' gestation, with spontaneous rupture of  membranes and spontaneous labor.  Group B strep positive.  Fetal well-being  reassuring.   PLAN:  Admit to labor and delivery, monitoring, penicillin G protocol,  Pitocin low-dose protocol p.r.n.  Expectant management toward probable  vaginal delivery.      Genia Del, M.D.  Electronically Signed     ML/MEDQ  D:  03/09/2005  T:  03/09/2005  Job:  [id number]

## 2010-10-07 NOTE — H&P (Signed)
NAMEAURIAH, HOLLINGS NO.:  [id number]   MEDICAL RECORD NO.:  [id number]          PATIENT TYPE:  MAT   LOCATION:  MATC                          [facility]ACILITY:  [facility]   PHYSICIAN:  Richardean Sale, M.D.   DATE OF BIRTH:  1981/02/16   DATE OF ADMISSION:  03/15/2005  DATE OF DISCHARGE:                                HISTORY & PHYSICAL   CHIEF COMPLAINT:  Fever.   HISTORY OF PRESENT ILLNESS:  This is a 30 year old gravida 1, para 1, white  female who is status post uncomplicated vaginal delivery on March 09, 2005, who noticed new onset of fever and chills at 5 p.m. today.  Temperature was 101 and then increased to 102.5 despite 500 mg of Tylenol.  The patient complains of chills, myalgias, generalized not feeling well, mid  back discomfort, mild pelvic cramping, no nausea, vomiting, diarrhea,  constipation, dysuria, but normal lochia with no foul odor.  The patient was  seen in the office earlier today with complaints of a stitch falling out  from the labial laceration, but was not febrile at that time.   PAST OBSTETRICAL HISTORY:  Uncomplicated spontaneous vaginal delivery on  March 09, 2005.   PAST MEDICAL HISTORY:  Positive for pyelonephritis as a child and  questionable enlarged kidney, but positive for situational depression.   PAST SURGICAL HISTORY:  Tonsillectomy.   GYNECOLOGIC HISTORY:  No history of abnormal Pap smears or STD's.   MEDICATIONS:  Prenatal vitamins, ibuprofen, Percocet.   ALLERGIES:  KEFLEX causes a rash.   FAMILY HISTORY:  Positive for diabetes.   PHYSICAL EXAMINATION:  VITAL SIGNS:  Temperature 101, pulse 133,  respirations 18, blood pressure 136/71.  GENERAL:  This is a well-developed, well-nourished white female who appears  ill, but in no acute distress.  HEENT:  Head normocephalic and atraumatic.  Oropharynx is clear.  NECK:  No palpable masses, no lymphadenopathy.  HEART:  Tachycardia with auscultated rates at 110.  No murmurs,  rubs, or  gallops.  LUNGS:  Clear to auscultation bilaterally.  BACK:  Bilateral CVA tenderness.  ABDOMEN:  Soft, mild tenderness to palpation throughout.  Questionable  fundal tenderness.  No palpable masses.  Liver and spleen are normal.  No  hernia.  EXTREMITIES:  No edema, nontender.  PELVIC:  The external genitalia appear consistent with recent vaginal  delivery. Episiotomy is healing.  There is a minor laceration in the left  labia that is hemostatic, no bleeding.  No evidence of infection or  breakdown.  Bimanual examination compromised secondary to the patient's  perineal discomfort.  The urethra and urethral meatus are normal.  Bladder  is slightly tender to palpation.  No cervical motion tenderness.  Fundus is  firm. The patient is slightly tender to palpation of the fundus with the  abdominal examining hand.  BREASTS:  Symmetric, nontender, no masses, no erythema, no lymphadenopathy,  no evidence of mastitis.   ASSESSMENT:  A 30 year old gravida 1, para 1, white female status post  uncomplicated delivery 6 days ago, now with fever.  Physical examination is  suggested of pyelonephritis  or possible endometritis.  No evidence of  mastitis.   PLAN:  Check UA C&S.  Check CBC.  Given significance of fever, will admit  for observation overnight and start IV antibiotics.  Choice of antibiotic to  be made pending laboratory results.  If urinalysis is consistent with  pyelonephritis, will obtain renal ultrasound.  Further disposition pending  results.      Richardean Sale, M.D.  Electronically Signed     JW/MEDQ  D:  03/15/2005  T:  03/15/2005  Job:  [id number]

## 2010-10-07 NOTE — Consult Note (Signed)
[name], [name]                ACCOUNT NO.:  [id number]   MEDICAL RECORD NO.:  [id number]          PATIENT TYPE:  INP   LOCATION:  9305                          [facility]ACILITY:  [facility]   PHYSICIAN:  Courtney Paris, M.D.DATE OF BIRTH:  Apr 23, 1981   DATE OF CONSULTATION:  03/17/2005  DATE OF DISCHARGE:                                   CONSULTATION   REASON FOR CONSULTATION:  Right hydronephrosis, apparent pyelonephritis.   BRIEF HISTORY:  This 30 year old white female had her first uncomplicated  vaginal delivery on March 09, 2005. She noticed the onset of fever and  chills at 5 p.m. on March 16, 2005. She had been well up until then. Her  temperature went up to over 102.5 despite Tylenol. She had chills, myalgias,  generalized just not feeling well, mild back discomfort and some pelvic  cramping. She was admitted for treatment and care.   PAST MEDICAL HISTORY:  Significant in that she did have pyelonephritis as a  43-month-old infant, and as a 69 year old little girl but has not had a UTI  since the summer of 2005. She did not have any during this present pregnancy  by history.   PAST SURGICAL HISTORY:  Tonsillectomy is the only other operation.   MEDICATIONS:  Vitamins, ibuprofen and pain pills with Percocet as needed.   ALLERGIES:  KEFLEX (a rash, again as a child).   FAMILY HISTORY/SOCIAL HISTORY REVIEW OF SYSTEMS:  As noted. She is married.  Her little girl is doing well, nursing well. Family history is positive for  diabetes but negative for kidney problems, stones, or significant heart  disease.   DATA REVIEWED:  White count of 22,000. Her maximum temperature was 102.6  yesterday, has been afebrile all day today. I reviewed her radiology  studies. She did have an ultrasound of her abdomen in April, 2005 which  showed normal sized kidneys with a little mild extrarenal pelvis on the left  without hydronephrosis at that time. She had no gallstones.   ROS:  pos for  HPI, everything else negative.  On admission here on March 15, 2005 she had an ultrasound which showed  some mild right hydronephrosis thought to be secondary to hydronephrosis of  pregnancy but no stones. Today she had a CT scan with contrast that showed  right hydronephrosis, slightly worse, to the pelvic brim at the level of the  iliac bifurcation. There was no stone seen in the mid ureter at the level of  the ureteral transition. Pelvis was negative except for a uterus which is  enlarged secondary to her recent postpartum state. Her left ovary was  unremarkable. There was no enlarged vein to suggest ovarian vein thrombosis  on the CT scan as interpreted by Dr. Molli Posey. Renal function test was normal  with creatinine of 0.7.   PHYSICAL EXAMINATION:  VITAL SIGNS:  Her temperature is presently afebrile.  Blood pressure is 120/75, pulse is 70, respirations 18.  GENERAL:  She is a pleasant, blonde, young white female in no acute  distress.  HEENT:  Clear.  NECK:  Supple.  ABDOMEN:  No CVA tenderness can be elicited. Her abdomen is soft, benign  without masses. No real tenderness although she has had some left lower  quadrant tenderness earlier today but it seems to be gone today.  PELVIC:  Deferred as this was done by Dr. Richardean Sale on admission.  EXTREMITIES:  Negative. No edema. Good distal pulses. Intact sensation to  light touch.   IMPRESSION:  1.  Right hydronephrosis.  2.  Resolving probable pyelonephritis despite negative urine cultures to      date.  3.  Recent normal vaginal delivery of a healthy female infant on October      f19, 2006.   RECOMMENDATIONS:  Switch her intravenous antibiotics which are presently  gentamicin, ampicillin and Flagyl, to oral Macrodantin which she can take  and continue to nurse. Will recheck her CBC and renal ultrasound in the  morning. If it is not worse, since she is presently really pretty  asymptomatic, and if she is afebrile, I think  we should let her go home and  we can just follow this up with a CT without contrast in my office next  week. If she does get worse she will obviously need to have a stent, but her  apparent pyelonephritis seems to be resolving. Despite the fact of negative  cultures to date.      Courtney Paris, M.D.  Electronically Signed     HMK/MEDQ  D:  03/17/2005  T:  03/17/2005  Job:  [id number]

## 2010-12-06 ENCOUNTER — Other Ambulatory Visit: Payer: Commercial Managed Care - PPO

## 2010-12-06 ENCOUNTER — Telehealth: Payer: Self-pay | Admitting: Family Medicine

## 2010-12-06 DIAGNOSIS — Z13220 Encounter for screening for lipoid disorders: Secondary | ICD-10-CM

## 2010-12-06 NOTE — Telephone Encounter (Signed)
Message copied by Excell Seltzer on Tue Dec 06, 2010  8:10 AM ------      Message from: Alvina Chou      Created: Fri Dec 02, 2010 11:13 AM       Patient is scheduled for Tuesday,CPX labs, please order future labs, Thanks , Camelia Eng

## 2010-12-07 ENCOUNTER — Other Ambulatory Visit (INDEPENDENT_AMBULATORY_CARE_PROVIDER_SITE_OTHER): Payer: Self-pay

## 2010-12-07 DIAGNOSIS — Z13220 Encounter for screening for lipoid disorders: Secondary | ICD-10-CM

## 2010-12-07 LAB — COMPREHENSIVE METABOLIC PANEL
BUN: 14 mg/dL (ref 6–23)
CO2: 27 mEq/L (ref 19–32)
Calcium: 9.2 mg/dL (ref 8.4–10.5)
Chloride: 107 mEq/L (ref 96–112)
Creatinine, Ser: 0.8 mg/dL (ref 0.4–1.2)
GFR: 89.71 mL/min (ref >60.00)
Glucose, Bld: 106 mg/dL — ABNORMAL HIGH (ref 70–99)

## 2010-12-07 LAB — LIPID PANEL
HDL: 57.5 mg/dL (ref >39.00)
Triglycerides: 113 mg/dL (ref 0.0–149.0)

## 2010-12-08 ENCOUNTER — Telehealth: Payer: Self-pay | Admitting: Family Medicine

## 2010-12-08 NOTE — Telephone Encounter (Signed)
Let pt know that we will discuss her labs at upcoming OV, but one of her liver function test was elevated... Have her stop any OTC supplements, tylenol, and decrease or stop any alcohol between now and then.

## 2010-12-08 NOTE — Telephone Encounter (Signed)
Patient advised as requested 

## 2010-12-15 ENCOUNTER — Encounter: Payer: Self-pay | Admitting: Family Medicine

## 2010-12-16 ENCOUNTER — Ambulatory Visit (INDEPENDENT_AMBULATORY_CARE_PROVIDER_SITE_OTHER): Payer: 59 | Admitting: Family Medicine

## 2010-12-16 ENCOUNTER — Encounter: Payer: Self-pay | Admitting: Family Medicine

## 2010-12-16 DIAGNOSIS — J4520 Mild intermittent asthma, uncomplicated: Secondary | ICD-10-CM

## 2010-12-16 DIAGNOSIS — R7309 Other abnormal glucose: Secondary | ICD-10-CM

## 2010-12-16 DIAGNOSIS — R7401 Elevation of levels of liver transaminase levels: Secondary | ICD-10-CM | POA: Insufficient documentation

## 2010-12-16 DIAGNOSIS — J45909 Unspecified asthma, uncomplicated: Secondary | ICD-10-CM

## 2010-12-16 DIAGNOSIS — R7303 Prediabetes: Secondary | ICD-10-CM | POA: Insufficient documentation

## 2010-12-16 NOTE — Assessment & Plan Note (Signed)
Likely due to tylenol use and possible fatty liver. Stop al tylenol. Recheck in 2 weeks. If remains elevated.. Check acute hep and US liver.

## 2010-12-16 NOTE — Assessment & Plan Note (Signed)
New diagnosis. Encouraged exercise, weight loss, healthy eating habits.

## 2010-12-16 NOTE — Progress Notes (Signed)
  Subjective:    Patient ID: [name], female    DOB: 1981-02-20, 30 y.o.   MRN: [id number]  HPI The patient is here for follow up on chronic health issues.  She sees GYN for CPE, pap/breast exam. Has mirena for birth control.  No longer on medication for mood... Resolved postpartum depression.  Von Willebrand's disease, no problems.  No exercise and moderate diet. She is trying to work on this more now.   Elevated LFTs: Struggling with plantar fasciits... Was taking 3-4 g of tylenol for 3-4 weeks. Getting orthotics, and referral to Dr. Lestine Box for further options.  Discussed pt concerns about daughter possibly having Ehler's Danlos syndrome with ligamentous laxity. She Has appt with geneticist.  Review of Systems  Constitutional: Negative for fever, fatigue and unexpected weight change.  HENT: Negative for ear pain, congestion, sore throat, sneezing, trouble swallowing and sinus pressure.   Eyes: Negative for pain and itching.  Respiratory: Negative for cough, shortness of breath and wheezing.   Cardiovascular: Negative for chest pain, palpitations and leg swelling.  Gastrointestinal: Negative for nausea, abdominal pain, diarrhea, constipation and blood in stool.  Genitourinary: Negative for dysuria, hematuria, vaginal discharge, difficulty urinating and menstrual problem.  Skin: Negative for rash.  Neurological: Negative for syncope, weakness, light-headedness, numbness and headaches.  Psychiatric/Behavioral: Negative for confusion and dysphoric mood. The patient is not nervous/anxious.        Objective:   Physical Exam  Constitutional: Vital signs are normal. She appears well-developed and well-nourished. She is cooperative.  Non-toxic appearance. She does not appear ill. No distress.  HENT:  Head: Normocephalic.  Right Ear: Hearing, tympanic membrane, external ear and ear canal normal. Tympanic membrane is not erythematous, not retracted and not bulging.  Left Ear:  Hearing, tympanic membrane, external ear and ear canal normal. Tympanic membrane is not erythematous, not retracted and not bulging.  Nose: No mucosal edema or rhinorrhea. Right sinus exhibits no maxillary sinus tenderness and no frontal sinus tenderness. Left sinus exhibits no maxillary sinus tenderness and no frontal sinus tenderness.  Mouth/Throat: Uvula is midline, oropharynx is clear and moist and mucous membranes are normal.  Eyes: Conjunctivae, EOM and lids are normal. Pupils are equal, round, and reactive to light. No foreign bodies found.  Neck: Trachea normal and normal range of motion. Neck supple. Carotid bruit is not present. No mass and no thyromegaly present.  Cardiovascular: Normal rate, regular rhythm, S1 normal, S2 normal, normal heart sounds, intact distal pulses and normal pulses.  Exam reveals no gallop and no friction rub.   No murmur heard. Pulmonary/Chest: Effort normal and breath sounds normal. Not tachypneic. No respiratory distress. She has no decreased breath sounds. She has no wheezes. She has no rhonchi. She has no rales.  Abdominal: Soft. Normal appearance and bowel sounds are normal. There is no tenderness.  Neurological: She is alert.  Skin: Skin is warm, dry and intact. No rash noted.  Psychiatric: Her speech is normal and behavior is normal. Judgment and thought content normal. Her mood appears not anxious. Cognition and memory are normal. She does not exhibit a depressed mood.          Assessment & Plan:

## 2010-12-16 NOTE — Patient Instructions (Addendum)
Refocus on healthy eating, and exercise 3-5 times a week. Decrease carbohydrates in diet. Stop tylenol, eliminate any alcohol.

## 2010-12-16 NOTE — Assessment & Plan Note (Signed)
Stable on no medicaiotns. Uses albuterol prn.

## 2011-01-02 ENCOUNTER — Other Ambulatory Visit: Payer: 59

## 2011-01-06 ENCOUNTER — Other Ambulatory Visit: Payer: 59

## 2011-02-09 LAB — POCT HEMOGLOBIN-HEMACUE
Hemoglobin: 14.6
Operator id: 268271

## 2011-02-09 LAB — POCT PREGNANCY, URINE: Operator id: 280881

## 2011-02-20 LAB — CREATININE, SERUM
Creatinine, Ser: 0.49
GFR calc non Af Amer: >60

## 2011-02-20 LAB — CBC
HCT: 39.6
Hemoglobin: 12.1
Hemoglobin: 13.2
MCHC: 34.3
Platelets: 215
RBC: 3.92
RDW: 13.5
WBC: 12.5 — ABNORMAL HIGH
WBC: 12.7 — ABNORMAL HIGH

## 2011-02-20 LAB — COMPREHENSIVE METABOLIC PANEL
Alkaline Phosphatase: 80
BUN: 4 — ABNORMAL LOW
CO2: 23
Chloride: 107
Glucose, Bld: 101 — ABNORMAL HIGH
Potassium: 3.5
Total Bilirubin: 0.4

## 2011-02-20 LAB — WOUND CULTURE

## 2011-02-20 LAB — URINE CULTURE
Colony Count: >=100000
Special Requests: NEGATIVE

## 2011-02-20 LAB — DIFFERENTIAL
Basophils Absolute: 0.2 — ABNORMAL HIGH
Basophils Relative: 1
Lymphocytes Relative: 24
Lymphocytes Relative: 28
Monocytes Absolute: 0.4
Monocytes Relative: 7
Neutro Abs: 6.9
Neutro Abs: 7.8 — ABNORMAL HIGH
Neutrophils Relative %: 62

## 2011-02-20 LAB — LIPASE, BLOOD: Lipase: 16

## 2011-02-20 LAB — AMYLASE: Amylase: 75

## 2011-02-21 LAB — COMPREHENSIVE METABOLIC PANEL
AST: 15 U/L (ref 0–37)
Albumin: 2.4 g/dL — ABNORMAL LOW (ref 3.5–5.2)
BUN: 2 mg/dL — ABNORMAL LOW (ref 6–23)
CO2: 21 mEq/L (ref 19–32)
Calcium: 8.6 mg/dL (ref 8.4–10.5)
Creatinine, Ser: 0.41 mg/dL (ref 0.4–1.2)
GFR calc Af Amer: >60 mL/min (ref >60)
GFR calc non Af Amer: >60 mL/min (ref >60)

## 2011-02-21 LAB — CBC
HCT: 35.8 % — ABNORMAL LOW (ref 36.0–46.0)
Hemoglobin: 12.3 g/dL (ref 12.0–15.0)
RBC: 3.81 MIL/uL — ABNORMAL LOW (ref 3.87–5.11)
RDW: 13.6 % (ref 11.5–15.5)

## 2011-03-06 LAB — URINALYSIS, ROUTINE W REFLEX MICROSCOPIC
Bilirubin Urine: NEGATIVE
Glucose, UA: NEGATIVE
Ketones, ur: NEGATIVE
Protein, ur: NEGATIVE
Urobilinogen, UA: 0.2

## 2011-03-06 LAB — CBC
HCT: 44.7
HCT: 34 — ABNORMAL LOW
Hemoglobin: 11.8 — ABNORMAL LOW
Hemoglobin: 14.4
MCHC: 34.4
MCHC: 34
MCHC: 34.8
MCV: 90.8
MCV: 92.1
Platelets: 271
RBC: 3.67 — ABNORMAL LOW
RBC: 4.59
RDW: 12.9
RDW: 12.9
WBC: 14.2 — ABNORMAL HIGH

## 2011-03-06 LAB — BASIC METABOLIC PANEL
BUN: 8
CO2: 28
Chloride: 107
GFR calc non Af Amer: >60
Glucose, Bld: 100 — ABNORMAL HIGH
Potassium: 4.6

## 2011-03-08 LAB — CBC
HCT: 40.3
Platelets: 196
RDW: 13
WBC: 15 — ABNORMAL HIGH

## 2011-03-08 LAB — URINALYSIS, ROUTINE W REFLEX MICROSCOPIC
Glucose, UA: NEGATIVE
Ketones, ur: NEGATIVE
Leukocytes, UA: NEGATIVE
Protein, ur: NEGATIVE
pH: 6

## 2011-03-08 LAB — DIFFERENTIAL
Basophils Absolute: 0
Eosinophils Absolute: 0.2
Eosinophils Relative: 1
Lymphocytes Relative: 20
Monocytes Absolute: 0.9 — ABNORMAL HIGH

## 2011-03-08 LAB — URINE CULTURE: Colony Count: 65000

## 2011-03-08 LAB — URINE MICROSCOPIC-ADD ON

## 2011-03-20 ENCOUNTER — Ambulatory Visit (INDEPENDENT_AMBULATORY_CARE_PROVIDER_SITE_OTHER): Payer: 59 | Admitting: Family Medicine

## 2011-03-20 ENCOUNTER — Encounter: Payer: Self-pay | Admitting: Family Medicine

## 2011-03-20 DIAGNOSIS — J069 Acute upper respiratory infection, unspecified: Secondary | ICD-10-CM

## 2011-03-20 MED ORDER — AZITHROMYCIN 250 MG PO TABS: ORAL_TABLET | ORAL | Status: AC

## 2011-03-20 MED ORDER — HYDROCODONE-HOMATROPINE 5-1.5 MG/5ML PO SYRP: 5.0000 mL | ORAL_SOLUTION | Freq: Three times a day (TID) | ORAL | Status: DC | PRN

## 2011-03-20 MED ORDER — ALBUTEROL SULFATE HFA 108 (90 BASE) MCG/ACT IN AERS: 2.0000 | INHALATION_SPRAY | Freq: Four times a day (QID) | RESPIRATORY_TRACT | Status: DC | PRN

## 2011-03-20 NOTE — Patient Instructions (Signed)
Feel better soon and good luck with Epic! Call me if no improvement over the next several days.

## 2011-03-20 NOTE — Progress Notes (Signed)
SUBJECTIVE:  [name] is a 30 y.o. female who complains of coryza, congestion, sneezing, productive cough and myalgias for 5 days. She denies a history of chest pain and chills and has a history of asthma. Patient denies smoke cigarettes.   OBJECTIVE: BP 120/72  Pulse 80  Temp(Src) 99.7 F (37.6 C) (Oral)  Wt 220 lb (99.791 kg)  She appears well, vital signs are as noted. Ears normal.  Throat and pharynx normal.  Neck supple. No adenopathy in the neck. Nose is congested. Sinuses non tender. Scattered exp wheezes.  ASSESSMENT:  bronchitis  PLAN: Zpack to cover atypicals. Symptomatic therapy suggested: push fluids, rest and return office visit prn if symptoms persist or worsen.  Call or return to clinic prn if these symptoms worsen or fail to improve as anticipated.

## 2011-05-31 ENCOUNTER — Encounter: Payer: Self-pay | Admitting: Family Medicine

## 2011-05-31 ENCOUNTER — Ambulatory Visit (INDEPENDENT_AMBULATORY_CARE_PROVIDER_SITE_OTHER): Payer: 59 | Admitting: Family Medicine

## 2011-05-31 VITALS — BP 126/78 | HR 64 | Temp 99.7°F | Wt 215.2 lb

## 2011-05-31 DIAGNOSIS — J019 Acute sinusitis, unspecified: Secondary | ICD-10-CM

## 2011-05-31 MED ORDER — HYDROCODONE-HOMATROPINE 5-1.5 MG/5ML PO SYRP: 5.0000 mL | ORAL_SOLUTION | Freq: Every evening | ORAL | Status: AC | PRN

## 2011-05-31 MED ORDER — AMOXICILLIN-POT CLAVULANATE 875-125 MG PO TABS: 1.0000 | ORAL_TABLET | Freq: Two times a day (BID) | ORAL | Status: DC

## 2011-05-31 NOTE — Progress Notes (Signed)
Addended by: Eustaquio Boyden on: 05/31/2011 09:36 AM   Modules accepted: Orders

## 2011-05-31 NOTE — Progress Notes (Signed)
  Subjective:    Patient ID: [name], female    DOB: April 17, 1981, 31 y.o.   MRN: [id number]  HPI CC: cold, sinusitis?  2.5 wk h/o cold sxs, then over weekend worsening head congestion, cough at night, facial pressure/pain, upper tooth pain, fevers/chills worse at night.  + pndrainage.  Blowing nose with yellow sputum, posterior pharynx drainage green/brown.  So far has tried nyquil, advil.  No abd pain, n/v, rashes,   No smokers at home.  + children sick recently.  H/o asthma, controlled with albuterol  Review of Systems Per HPI    Objective:   Physical Exam  Nursing note and vitals reviewed. Constitutional: She appears well-developed and well-nourished. No distress.  HENT:  Head: Normocephalic and atraumatic.  Right Ear: Hearing, tympanic membrane, external ear and ear canal normal.  Left Ear: Hearing, tympanic membrane, external ear and ear canal normal.  Nose: No mucosal edema or rhinorrhea. Right sinus exhibits maxillary sinus tenderness. Right sinus exhibits no frontal sinus tenderness. Left sinus exhibits maxillary sinus tenderness. Left sinus exhibits no frontal sinus tenderness.  Mouth/Throat: Uvula is midline, oropharynx is clear and moist and mucous membranes are normal. No oropharyngeal exudate, posterior oropharyngeal edema, posterior oropharyngeal erythema or tonsillar abscesses.  Eyes: Conjunctivae and EOM are normal. Pupils are equal, round, and reactive to light. No scleral icterus.  Neck: Normal range of motion. Neck supple.  Cardiovascular: Normal rate, regular rhythm, normal heart sounds and intact distal pulses.   No murmur heard. Pulmonary/Chest: Effort normal and breath sounds normal. No respiratory distress. She has no wheezes. She has no rales.  Lymphadenopathy:    She has no cervical adenopathy.  Skin: Skin is warm and dry. No rash noted.       Assessment & Plan:

## 2011-05-31 NOTE — Patient Instructions (Signed)
You have a sinus infection. Take medicine as prescribed: augmentin for 10 days, may use hycodan for cough at night time. Push fluids and plenty of rest. Nasal saline irrigation or neti pot to help drain sinuses. May use simple mucinex with plenty of fluid to help mobilize mucous. Let us know if fever >101.5, trouble opening/closing mouth, difficulty swallowing, or worsening - you may need to be seen again.

## 2011-05-31 NOTE — Assessment & Plan Note (Signed)
Maxillary sinusitis. Given duration, will treat with augmentin (States has taken well in past). Hycodan cough syrup at night, mucinex and nasal saline. Update [place] if not improving as expected. Advised to use albuterol more liberally next few days, lungs clear today.

## 2011-07-25 ENCOUNTER — Ambulatory Visit (INDEPENDENT_AMBULATORY_CARE_PROVIDER_SITE_OTHER): Payer: 59 | Admitting: Family Medicine

## 2011-07-25 ENCOUNTER — Encounter: Payer: Self-pay | Admitting: Family Medicine

## 2011-07-25 VITALS — BP 110/72 | HR 107 | Temp 99.3°F | Wt 210.0 lb

## 2011-07-25 DIAGNOSIS — R062 Wheezing: Secondary | ICD-10-CM

## 2011-07-25 DIAGNOSIS — J45909 Unspecified asthma, uncomplicated: Secondary | ICD-10-CM

## 2011-07-25 MED ORDER — LEVALBUTEROL HCL 0.63 MG/3ML IN NEBU
0.6300 mg | INHALATION_SOLUTION | Freq: Once | RESPIRATORY_TRACT | Status: AC
  Administered 2011-07-25: 0.63 mg via RESPIRATORY_TRACT

## 2011-07-25 MED ORDER — HYDROCOD POLST-CHLORPHEN POLST 10-8 MG/5ML PO LQCR: 5.0000 mL | Freq: Two times a day (BID) | ORAL | Status: DC | PRN

## 2011-07-25 MED ORDER — PREDNISONE 10 MG PO TABS: ORAL_TABLET | ORAL | Status: DC

## 2011-07-25 MED ORDER — AZITHROMYCIN 250 MG PO TABS: ORAL_TABLET | ORAL | Status: AC

## 2011-07-25 NOTE — Assessment & Plan Note (Signed)
With prod cough and wheeze Much imp after xeopenex nmt in office  Will cover with zpak and pred taper  Also has her prn albuterol mdi Given tussionex for cough prn -use with caution Update if not starting to improve in a week or if worsening

## 2011-07-25 NOTE — Patient Instructions (Signed)
You have asthmatic bronchitis  Take the zithromax as directed and the prednisone taper Use albuterol as directed as needed If wheezing worsens - update [place] or seek care  Use tusssionex with caution- is strong and sedating  Update if not starting to improve in a week or if worsening

## 2011-07-25 NOTE — Progress Notes (Signed)
Subjective:    Patient ID: [name], female    DOB: 1981/02/16, 31 y.o.   MRN: [id number]  HPI Here for uri symptoms  Started getting sick this wkend with runny nose / cong/ st - felt like a cold  Has had sick kids in house- no strep or flu   Now has a really nasty cough with wheezing  Chest is sore and burning  Phlegm - yellow/ thin  Has asthma - and wheezing is increasing  Is tight  Albuterol helps -- some -- ventolin HFA No maint inhaler  No prednisone in over a year   Sharp pain in R lower chest   Patient Active Problem List  Diagnoses  . VON [facility] DISEASE  . PYELONEPHRITIS, HX OF  . NEPHROLITHIASIS, HX OF  . CHICKENPOX, HX OF  . Asthma, mild intermittent  . OTHER CHRONIC SINUSITIS  . Prediabetes  . Elevated transaminase level  . Sinusitis acute  . Bronchitis, asthmatic   Past Medical History  Diagnosis Date  . Blood transfusion   . Chronic kidney disease     stones   Past Surgical History  Procedure Date  . Nephrosotmy tube 2008 and 2009  . Kidney stone surgery    History  Substance Use Topics  . Smoking status: Never Smoker   . Smokeless tobacco: Not on file  . Alcohol Use: Yes     Occasional   Family History  Problem Relation Age of Onset  . Hypertension Father   . Diabetes Father   . Coronary artery disease Maternal Grandfather   . Heart disease Maternal Grandfather 60  . Coronary artery disease Paternal Grandfather   . Diabetes Paternal Grandfather   . Heart attack Paternal Grandfather 44   Allergies  Allergen Reactions  . Cephalexin     REACTION: rash   Current Outpatient Prescriptions on File Prior to Visit  Medication Sig Dispense Refill  . albuterol (VENTOLIN HFA) 108 (90 BASE) MCG/ACT inhaler Inhale 2 puffs into the lungs every 6 (six) hours as needed.        Marland Kitchen levonorgestrel (MIRENA) 20 MCG/24HR IUD 1 each by Intrauterine route once.            Review of Systems Review of Systems  Constitutional: Negative for fever,  appetite change, and unexpected weight change.  Eyes: Negative for pain and visual disturbance.  ENT pos for congestion/ st/ lost voice  Respiratory: Negative for sob, pos for wheeze and cough    Cardiovascular: Negative for cp or palpitations    Gastrointestinal: Negative for nausea, diarrhea and constipation.  Genitourinary: Negative for urgency and frequency.  Skin: Negative for pallor or rash   Neurological: Negative for weakness, light-headedness, numbness and headaches.  Hematological: Negative for adenopathy. Does not bruise/bleed easily.  Psychiatric/Behavioral: Negative for dysphoric mood. The patient is not nervous/anxious.          Objective:   Physical Exam  Constitutional: She appears well-developed and well-nourished. No distress.       overwt and well appearing   HENT:  Head: Normocephalic and atraumatic.  Right Ear: External ear normal.  Left Ear: External ear normal.  Mouth/Throat: Oropharynx is clear and moist.       Nares cong and injected  Throat-post nasal drip No sinus tenderness  Eyes: Conjunctivae and EOM are normal. Pupils are equal, round, and reactive to light. No scleral icterus.  Neck: Normal range of motion. Neck supple. No JVD present. No thyromegaly present.  Cardiovascular: Normal rate, regular  rhythm and normal heart sounds.  Exam reveals no gallop.   Pulmonary/Chest: Effort normal. No respiratory distress. She has wheezes. She has no rales. She exhibits tenderness.       Diffuse exp wheezing totally cleared by xeopenex treatment  No rales or rhonchi Fair air exch  Lymphadenopathy:    She has no cervical adenopathy.  Neurological: She is alert.  Skin: Skin is warm and dry. No rash noted.  Psychiatric: She has a normal mood and affect.          Assessment & Plan:

## 2011-10-03 ENCOUNTER — Encounter: Payer: Self-pay | Admitting: Family Medicine

## 2011-10-03 ENCOUNTER — Ambulatory Visit (INDEPENDENT_AMBULATORY_CARE_PROVIDER_SITE_OTHER): Payer: 59 | Admitting: Family Medicine

## 2011-10-03 VITALS — BP 102/80 | HR 80 | Temp 99.3°F | Wt 201.0 lb

## 2011-10-03 DIAGNOSIS — J069 Acute upper respiratory infection, unspecified: Secondary | ICD-10-CM

## 2011-10-03 MED ORDER — HYDROCOD POLST-CHLORPHEN POLST 10-8 MG/5ML PO LQCR: 5.0000 mL | Freq: Two times a day (BID) | ORAL | Status: DC | PRN

## 2011-10-03 MED ORDER — AMOXICILLIN-POT CLAVULANATE 875-125 MG PO TABS: 1.0000 | ORAL_TABLET | Freq: Two times a day (BID) | ORAL | Status: DC

## 2011-10-03 MED ORDER — AMOXICILLIN-POT CLAVULANATE 875-125 MG PO TABS: 1.0000 | ORAL_TABLET | Freq: Two times a day (BID) | ORAL | Status: AC

## 2011-10-03 NOTE — Patient Instructions (Signed)
Take Augmentin as directed.  Drink lots of fluids.  Treat sympotmatically with Mucinex, nasal saline irrigation, and Tylenol/Ibuprofen. Also try claritin D or zyrtec D over the counter- two times a day as needed ( have to sign for them at pharmacy). You can use warm compresses.  Cough suppressant at night. Call if not improving as expected in 5-7 days.    

## 2011-10-03 NOTE — Progress Notes (Signed)
SUBJECTIVE:  [name] is a 31 y.o. female who complains of coryza, congestion, right sinus pain and fever for 3 days. She denies a history of anorexia, chest pain, dizziness and shortness of breath and has a history of asthma. Patient denies smoke cigarettes.   Had a URI for over a week with now an abrupt onset of right sinus pressure and fever, Tmax 102.7.  Patient Active Problem List  Diagnoses  . VON [facility] DISEASE  . PYELONEPHRITIS, HX OF  . NEPHROLITHIASIS, HX OF  . CHICKENPOX, HX OF  . Asthma, mild intermittent  . OTHER CHRONIC SINUSITIS  . Prediabetes  . Elevated transaminase level  . Sinusitis acute  . Bronchitis, asthmatic  . URI (upper respiratory infection)   Past Medical History  Diagnosis Date  . Blood transfusion   . Chronic kidney disease     stones   Past Surgical History  Procedure Date  . Nephrosotmy tube 2008 and 2009  . Kidney stone surgery    History  Substance Use Topics  . Smoking status: Never Smoker   . Smokeless tobacco: Not on file  . Alcohol Use: Yes     Occasional   Family History  Problem Relation Age of Onset  . Hypertension Father   . Diabetes Father   . Coronary artery disease Maternal Grandfather   . Heart disease Maternal Grandfather 60  . Coronary artery disease Paternal Grandfather   . Diabetes Paternal Grandfather   . Heart attack Paternal Grandfather 32   Allergies  Allergen Reactions  . Cephalexin     REACTION: rash   Current Outpatient Prescriptions on File Prior to Visit  Medication Sig Dispense Refill  . albuterol (VENTOLIN HFA) 108 (90 BASE) MCG/ACT inhaler Inhale 2 puffs into the lungs every 6 (six) hours as needed.        . chlorpheniramine-HYDROcodone (TUSSIONEX PENNKINETIC ER) 10-8 MG/5ML LQCR Take 5 mLs by mouth every 12 (twelve) hours as needed.  140 mL  0  . levonorgestrel (MIRENA) 20 MCG/24HR IUD 1 each by Intrauterine route once.        . predniSONE (DELTASONE) 10 MG tablet Take 3 pills by mouth  once daily for 3 days, then 2 pills once daily for 3 days, then 1 pill once daily for 3 days  18 tablet  0   The PMH, PSH, Social History, Family History, Medications, and allergies have been reviewed in [facility], and have been updated if relevant.  OBJECTIVE: BP 102/80  Pulse 80  Temp(Src) 99.3 F (37.4 C) (Oral)  Wt 201 lb (91.173 kg)  She appears well, vital signs are as noted. Ears normal.  Throat and pharynx normal.  Neck supple. No adenopathy in the neck. Nose is congested. Sinuses  Tender to palpation, right > left. The chest is clear, without wheezes or rales.  ASSESSMENT:  sinusitis  PLAN: Given duration and progression of symptoms, will treat for bacterial sinusitis with Augmentin (she can tolerate). Symptomatic therapy suggested: push fluids, rest and return office visit prn if symptoms persist or worsen.  Call or return to clinic prn if these symptoms worsen or fail to improve as anticipated.

## 2012-04-12 ENCOUNTER — Ambulatory Visit (INDEPENDENT_AMBULATORY_CARE_PROVIDER_SITE_OTHER): Payer: 59 | Admitting: Family Medicine

## 2012-04-12 ENCOUNTER — Encounter: Payer: Self-pay | Admitting: Family Medicine

## 2012-04-12 VITALS — BP 120/82 | HR 112 | Temp 99.3°F | Ht 65.0 in | Wt 199.8 lb

## 2012-04-12 DIAGNOSIS — J069 Acute upper respiratory infection, unspecified: Secondary | ICD-10-CM | POA: Insufficient documentation

## 2012-04-12 NOTE — Assessment & Plan Note (Signed)
Symptom care. 

## 2012-04-12 NOTE — Patient Instructions (Addendum)
Start mucinex DM. Start zyrtec at bedtime. Starting nasal saline irrigation 2-3 times a day. Call if not turning the corner at day 7-10 of illness.

## 2012-04-12 NOTE — Progress Notes (Signed)
  Subjective:    Patient ID: [name], female    DOB: February 11, 1981, 31 y.o.   MRN: [id number]  Cough This is a new problem. The current episode started yesterday (Started with sinus pressure 3 days ago, cough in last 24 hours.). The problem has been gradually worsening. The problem occurs every few minutes. The cough is non-productive. Associated symptoms include headaches, nasal congestion, postnasal drip and rhinorrhea. Pertinent negatives include no ear congestion, ear pain, fever, sore throat, shortness of breath or wheezing. Associated symptoms comments: Sinus pressure. Nothing aggravates the symptoms. Treatments tried: Advail, Benadryl, not requireing albuterol. The treatment provided mild relief. Her past medical history is significant for asthma and environmental allergies.      Review of Systems  Constitutional: Negative for fever.  HENT: Positive for rhinorrhea and postnasal drip. Negative for ear pain and sore throat.   Respiratory: Positive for cough. Negative for shortness of breath and wheezing.   Neurological: Positive for headaches.  Hematological: Positive for environmental allergies.       Objective:   Physical Exam  Constitutional: Vital signs are normal. She appears well-developed and well-nourished. She is cooperative.  Non-toxic appearance. She does not appear ill. No distress.  HENT:  Head: Normocephalic.  Right Ear: Hearing, tympanic membrane, external ear and ear canal normal. Tympanic membrane is not erythematous, not retracted and not bulging.  Left Ear: Hearing, tympanic membrane, external ear and ear canal normal. Tympanic membrane is not erythematous, not retracted and not bulging.  Nose: Mucosal edema and rhinorrhea present. Right sinus exhibits maxillary sinus tenderness. Right sinus exhibits no frontal sinus tenderness. Left sinus exhibits maxillary sinus tenderness. Left sinus exhibits no frontal sinus tenderness.  Mouth/Throat: Uvula is midline,  oropharynx is clear and moist and mucous membranes are normal.  Eyes: Conjunctivae normal, EOM and lids are normal. Pupils are equal, round, and reactive to light. No foreign bodies found.  Neck: Trachea normal and normal range of motion. Neck supple. Carotid bruit is not present. No mass and no thyromegaly present.  Cardiovascular: Normal rate, regular rhythm, S1 normal, S2 normal, normal heart sounds, intact distal pulses and normal pulses.  Exam reveals no gallop and no friction rub.   No murmur heard. Pulmonary/Chest: Effort normal and breath sounds normal. Not tachypneic. No respiratory distress. She has no decreased breath sounds. She has no wheezes. She has no rhonchi. She has no rales.  Neurological: She is alert.  Skin: Skin is warm, dry and intact. No rash noted.  Psychiatric: Her speech is normal and behavior is normal. Judgment normal. Her mood appears not anxious. Cognition and memory are normal. She does not exhibit a depressed mood.          Assessment & Plan:

## 2012-04-22 ENCOUNTER — Other Ambulatory Visit (HOSPITAL_COMMUNITY): Payer: Self-pay | Admitting: Orthopedic Surgery

## 2012-04-22 DIAGNOSIS — M79645 Pain in left finger(s): Secondary | ICD-10-CM

## 2012-04-30 ENCOUNTER — Ambulatory Visit (HOSPITAL_COMMUNITY)
Admission: RE | Admit: 2012-04-30 | Discharge: 2012-04-30 | Disposition: A | Discharge status: Home / Self Care | Payer: 59 | Source: Ambulatory Visit | Attending: Orthopedic Surgery | Admitting: Orthopedic Surgery

## 2012-04-30 DIAGNOSIS — M79645 Pain in left finger(s): Secondary | ICD-10-CM

## 2012-04-30 DIAGNOSIS — M79609 Pain in unspecified limb: Secondary | ICD-10-CM | POA: Insufficient documentation

## 2012-07-06 ENCOUNTER — Other Ambulatory Visit: Payer: Self-pay

## 2013-03-27 ENCOUNTER — Other Ambulatory Visit: Payer: Self-pay

## 2016-01-11 DIAGNOSIS — Z1151 Encounter for screening for human papillomavirus (HPV): Secondary | ICD-10-CM | POA: Diagnosis not present

## 2016-01-11 DIAGNOSIS — Z01419 Encounter for gynecological examination (general) (routine) without abnormal findings: Secondary | ICD-10-CM | POA: Diagnosis not present

## 2016-03-28 DIAGNOSIS — L219 Seborrheic dermatitis, unspecified: Secondary | ICD-10-CM | POA: Diagnosis not present

## 2016-03-28 DIAGNOSIS — Z23 Encounter for immunization: Secondary | ICD-10-CM | POA: Diagnosis not present

## 2016-07-06 DIAGNOSIS — R05 Cough: Secondary | ICD-10-CM | POA: Diagnosis not present

## 2016-07-06 DIAGNOSIS — J111 Influenza due to unidentified influenza virus with other respiratory manifestations: Secondary | ICD-10-CM | POA: Diagnosis not present

## 2016-07-06 DIAGNOSIS — E860 Dehydration: Secondary | ICD-10-CM | POA: Diagnosis not present

## 2016-08-06 DIAGNOSIS — N390 Urinary tract infection, site not specified: Secondary | ICD-10-CM | POA: Diagnosis not present

## 2016-08-06 DIAGNOSIS — N200 Calculus of kidney: Secondary | ICD-10-CM | POA: Diagnosis not present

## 2016-12-11 ENCOUNTER — Ambulatory Visit (INDEPENDENT_AMBULATORY_CARE_PROVIDER_SITE_OTHER)
Admission: RE | Admit: 2016-12-11 | Discharge: 2016-12-11 | Disposition: A | Discharge status: Home / Self Care | Payer: 59 | Source: Ambulatory Visit | Attending: Family Medicine | Admitting: Family Medicine

## 2016-12-11 ENCOUNTER — Ambulatory Visit (INDEPENDENT_AMBULATORY_CARE_PROVIDER_SITE_OTHER): Payer: 59 | Admitting: Family Medicine

## 2016-12-11 ENCOUNTER — Encounter: Payer: Self-pay | Admitting: Family Medicine

## 2016-12-11 VITALS — BP 110/84 | HR 104 | Temp 99.5°F | Wt 120.2 lb

## 2016-12-11 DIAGNOSIS — R109 Unspecified abdominal pain: Secondary | ICD-10-CM | POA: Diagnosis not present

## 2016-12-11 DIAGNOSIS — N200 Calculus of kidney: Secondary | ICD-10-CM

## 2016-12-11 LAB — POC URINALSYSI DIPSTICK (AUTOMATED)
BILIRUBIN UA: NEGATIVE
Glucose, UA: NEGATIVE
KETONES UA: NEGATIVE
Leukocytes, UA: NEGATIVE
Nitrite, UA: NEGATIVE
PH UA: 6 (ref 5.0–8.0)
Protein, UA: NEGATIVE
Urobilinogen, UA: 0.2 E.U./dL

## 2016-12-11 MED ORDER — TAMSULOSIN HCL 0.4 MG PO CAPS: 0.4000 mg | ORAL_CAPSULE | Freq: Every day | ORAL | 0 refills | Status: DC

## 2016-12-11 MED ORDER — OXYCODONE HCL 5 MG PO TABS
5.0000 mg | ORAL_TABLET | Freq: Four times a day (QID) | ORAL | 0 refills | Status: DC | PRN
Start: 2016-12-11 — End: 2020-08-02

## 2016-12-11 NOTE — Progress Notes (Signed)
H/o stones and nephrostomy tube in the past, no nephrostomy now.  S/p uroscopy in the past.  She had done well for years until this March when she passed a stone.  Now with sx for the last 6 days on the R flank.  It has moved some in the few days.  No fevers.  Blood in urine.  No vomiting, but some nausea.  No burning with urination.  Taking advil and tylenol for pain.    Prev calcium phosphate stones per patient report.    No menses with IUD in place.   Sig weight loss in the meantime, over the last year, intentionally.    PMH and SH reviewed  ROS: Per HPI unless specifically indicated in ROS section   Meds, vitals, and allergies reviewed.   GEN: nad, alert and oriented HEENT: mucous membranes moist NECK: supple w/o LA CV: rrr.  no murmur PULM: ctab, no inc wob ABD: soft, +bs, R flank/R lower abd ttp EXT: no edema SKIN: no acute rash

## 2016-12-11 NOTE — Patient Instructions (Signed)
Strain your urine, drink a lot of water and start flomax.  Use oxycodone for pain.  Take care.  Glad to see you.  Update us if not passing a stone in a few days.

## 2016-12-12 ENCOUNTER — Encounter: Payer: Self-pay | Admitting: Family Medicine

## 2016-12-12 NOTE — Assessment & Plan Note (Signed)
Presumed renal stone. She has hematuria on urinalysis, discussed with patient. She has progressive migratory right flank pain that felt like previous renal stones. KUB discussed with patient, negative. At this point start Flomax, use oxycodone as needed, increase fluids by mouth. Update us as needed. At this point still okay for outpatient follow-up. She agrees with plan. Routed to PCP as FYI. She will strain her urine in the meantime. Unclear she has a small stone that could not be seen on the KUB or if she has another type of stone such as a urate stone that would not be visible. Discussed with patient. At this point we'll hold off on CAT scan, she agrees. >30 minutes spent in face to face time with patient, >50% spent in counselling or coordination of care

## 2018-04-21 ENCOUNTER — Telehealth: Payer: 59 | Admitting: Family

## 2018-04-21 DIAGNOSIS — B002 Herpesviral gingivostomatitis and pharyngotonsillitis: Secondary | ICD-10-CM

## 2018-04-21 MED ORDER — VALACYCLOVIR HCL 1 G PO TABS
2000.0000 mg | ORAL_TABLET | Freq: Two times a day (BID) | ORAL | 0 refills | Status: DC
Start: 2018-04-21 — End: 2020-08-02

## 2018-04-21 NOTE — Progress Notes (Signed)
We are sorry that you are not feeling well.  Here is how we plan to help!  Based on what you have shared with me it looks like you have a Cold Sore. A cold sore (fever blister) is a skin infection caused by the herpes simplex virus (HSV-1). HSV-1 is closely related to the virus that causes genital herpes (HSV-2), but they are not the same even though both viruses can cause oral and genital infections. Cold sores are small, fluid-filled sores inside of the mouth or on the lips, gums, nose, chin, cheeks, or fingers.   The herpes simplex virus can be easily passed (contagious) to other people through close personal contact, such as kissing or sharing personal items. The virus can also spread to other parts of the body, such as the eyes or genitals. Cold sores are contagious until the sores crust over completely. They often heal within 2 weeks. Once a person is infected, the herpes simplex virus remains permanently in the body. Therefore, there is no cure for cold sores, and they often recur when a person is tired, stressed, sick, or gets too much sun. Additional factors that can cause a recurrence include hormone changes in menstruation or pregnancy, certain drugs, and cold weather.  CAUSES  Cold sores are caused by the herpes simplex virus. The virus is spread from person to person through close contact, such as through kissing, touching the affected area, or sharing personal items such as lip balm, razors, or eating utensils.   I have sent in a prescription to your pharmacy of Valacyclovir, or Valtrex, 2000 mg twice a day for 1 day.   HOME CARE INSTRUCTIONS   Only take over-the-counter or prescription medicines for pain, discomfort, or fever as directed by your caregiver. Do not use aspirin.   Use a cotton-tip swab to apply creams or gels to your sores.   Do not touch the sores or pick the scabs. Wash your hands often. Do not touch your eyes without washing your hands first.   Avoid kissing, oral  sex, and sharing personal items until sores heal.   Apply an ice pack on your sores for 10-15 minutes to ease any discomfort.   Avoid hot, cold, or salty foods because they may hurt your mouth. Eat a soft, bland diet to avoid irritating the sores. Use a straw to drink if you have pain when drinking out of a glass.   Keep sores clean and dry to prevent an infection of other tissues.   Avoid the sun and limit stress if these things trigger outbreaks. If sun causes cold sores, apply sunscreen on the lips before being out in the sun.  SEEK MEDICAL CARE IF:   You have a fever or persistent symptoms for more than 2-3 days.   You have a fever and your symptoms suddenly get worse.   You have pus, not clear fluid, coming from the sores.   You have redness that is spreading.   You have pain or irritation in your eye.   You get sores on your genitals.   Your sores do not heal within 2 weeks.   You have a weakened immune system.   You have frequent recurrences of cold sores.    MAKE SURE YOU   Understand these instructions.  Will watch your condition.  Will get help right away if you are not doing well or get worse.  Your e-visit answers were reviewed by a board certified advanced clinical practitioner to complete your   personal care plan.  Depending on the condition, your plan could have included both over the counter or prescription medications.  If there is a problem please reply  once you have received a response from your provider.  Your safety is important to us.  If you have drug allergies check your prescription carefully.    You can use MyChart to ask questions about today's visit, request a non-urgent call back, or ask for a work or school excuse.  You will get an e-mail in the next two days asking about your experience.  I hope that your e-visit has been valuable and will speed your recovery. Thank you for using e-visits.     

## 2018-04-29 DIAGNOSIS — N23 Unspecified renal colic: Secondary | ICD-10-CM | POA: Diagnosis not present

## 2018-04-29 DIAGNOSIS — R399 Unspecified symptoms and signs involving the genitourinary system: Secondary | ICD-10-CM | POA: Diagnosis not present

## 2018-04-30 DIAGNOSIS — R310 Gross hematuria: Secondary | ICD-10-CM | POA: Diagnosis not present

## 2018-04-30 DIAGNOSIS — Z87442 Personal history of urinary calculi: Secondary | ICD-10-CM | POA: Diagnosis not present

## 2018-05-07 DIAGNOSIS — N201 Calculus of ureter: Secondary | ICD-10-CM | POA: Diagnosis not present

## 2018-05-30 DIAGNOSIS — N200 Calculus of kidney: Secondary | ICD-10-CM | POA: Diagnosis not present

## 2018-06-12 ENCOUNTER — Ambulatory Visit: Payer: Self-pay | Admitting: Urology

## 2019-08-08 DIAGNOSIS — H60332 Swimmer's ear, left ear: Secondary | ICD-10-CM | POA: Diagnosis not present

## 2019-08-08 DIAGNOSIS — H6122 Impacted cerumen, left ear: Secondary | ICD-10-CM | POA: Diagnosis not present

## 2019-10-29 DIAGNOSIS — L218 Other seborrheic dermatitis: Secondary | ICD-10-CM | POA: Diagnosis not present

## 2019-12-19 DIAGNOSIS — K219 Gastro-esophageal reflux disease without esophagitis: Secondary | ICD-10-CM | POA: Diagnosis not present

## 2019-12-19 DIAGNOSIS — F458 Other somatoform disorders: Secondary | ICD-10-CM | POA: Diagnosis not present

## 2020-01-16 DIAGNOSIS — K219 Gastro-esophageal reflux disease without esophagitis: Secondary | ICD-10-CM | POA: Diagnosis not present

## 2020-01-16 DIAGNOSIS — R221 Localized swelling, mass and lump, neck: Secondary | ICD-10-CM | POA: Diagnosis not present

## 2020-01-20 ENCOUNTER — Other Ambulatory Visit: Payer: Self-pay | Admitting: Physician Assistant

## 2020-01-20 DIAGNOSIS — R221 Localized swelling, mass and lump, neck: Secondary | ICD-10-CM

## 2020-01-30 ENCOUNTER — Ambulatory Visit: Payer: 59

## 2020-06-22 ENCOUNTER — Other Ambulatory Visit: Payer: Self-pay | Admitting: Orthopedic Surgery

## 2020-06-22 DIAGNOSIS — S29012A Strain of muscle and tendon of back wall of thorax, initial encounter: Secondary | ICD-10-CM | POA: Diagnosis not present

## 2020-06-22 DIAGNOSIS — M542 Cervicalgia: Secondary | ICD-10-CM | POA: Insufficient documentation

## 2020-08-02 ENCOUNTER — Other Ambulatory Visit: Payer: Self-pay | Admitting: Physician Assistant

## 2020-08-02 ENCOUNTER — Telehealth: Payer: 59 | Admitting: Physician Assistant

## 2020-08-02 DIAGNOSIS — B029 Zoster without complications: Secondary | ICD-10-CM | POA: Diagnosis not present

## 2020-08-02 MED ORDER — VALACYCLOVIR HCL 1 G PO TABS: 1000.0000 mg | ORAL_TABLET | Freq: Three times a day (TID) | ORAL | 0 refills | Status: DC

## 2020-08-02 NOTE — Progress Notes (Signed)
I have spent 5 minutes in review of e-visit questionnaire, review and updating patient chart, medical decision making and response to patient.   Jaquay Posthumus Cody Idonia Zollinger, PA-C    

## 2020-08-02 NOTE — Progress Notes (Signed)
E-visit for Shingles   We are sorry that you are not feeling well. Here is how we plan to help!  Based on what you shared with me it looks like you have shingles. I am more concerned about this giving the burning sensation you have of the cheek/under the eye.   Shingles or herpes zoster, is a common infection of the nerves.  It is a painful rash caused by the herpes zoster virus.  This is the same virus that causes chickenpox.  After a person has chickenpox, the virus remains inactive in the nerve cells.  Years later, the virus can become active again and travel to the skin.  It typically will appear on one side of the face or body.  Burning or shooting pain, tingling, or itching are early signs of the infection.  Blisters typically scab over in 7 to 10 days and clear up within 2-4 weeks. Shingles is only contagious to people that have never had the chickenpox, the chickenpox vaccine, or anyone who has a compromised immune system.  You should avoid contact with these type of people until your blisters scab over.  I have prescribed Valacyclovir 1g three times daily for 7 days.   If you note any eye pain, vision change or rash near the eye, please contact your PCP or eye doctor immediately for evaluation, or be seen at the nearest ER.    HOME CARE: . Apply ice packs (wrapped in a thin towel), cool compresses, or soak in cool bath to help reduce pain. . Use calamine lotion to calm itchy skin. . Avoid scratching the rash. . Avoid direct sunlight.  GET HELP RIGHT AWAY IF: . Symptoms that don't away after treatment. . A rash or blisters near your eye. . Increased drainage, fever, or rash after treatment. . Severe pain that doesn't go away.   MAKE SURE YOU    Understand these instructions.  Will watch your condition.  Will get help right away if you are not doing well or get worse.  Thank you for choosing an e-visit. Your e-visit answers were reviewed by a board certified advanced  clinical practitioner to complete your personal care plan. Depending upon the condition, your plan could have included both over the counter or prescription medications.  Please review your pharmacy choice. Make sure the pharmacy is open so you can pick up prescription now. If there is a problem, you may contact your provider through [employer] and have the prescription routed to another pharmacy.  Your safety is important to [place]. If you have drug allergies check your prescription carefully.   For the next 24 hours you can use MyChart to ask questions about today's visit, request a non-urgent call back, or ask for a work or school excuse.  You will get an email in the next two days asking about your experience. I hope that your e-visit has been valuable and will speed your recovery

## 2020-08-02 NOTE — Progress Notes (Signed)
Message sent to patient requesting further input regarding current symptoms. Awaiting patient response.  

## 2020-08-06 ENCOUNTER — Other Ambulatory Visit: Payer: Self-pay | Admitting: Orthopedic Surgery

## 2020-08-06 DIAGNOSIS — M7542 Impingement syndrome of left shoulder: Secondary | ICD-10-CM | POA: Diagnosis not present

## 2020-09-06 ENCOUNTER — Other Ambulatory Visit: Payer: Self-pay

## 2020-09-06 DIAGNOSIS — M7542 Impingement syndrome of left shoulder: Secondary | ICD-10-CM | POA: Diagnosis not present

## 2020-09-06 MED ORDER — CELECOXIB 200 MG PO CAPS
ORAL_CAPSULE | ORAL | 0 refills | Status: DC
  Filled 2020-09-06: qty 30, 30d supply, fill #0

## 2020-10-19 DIAGNOSIS — D6809 Other von Willebrand disease: Secondary | ICD-10-CM | POA: Insufficient documentation

## 2020-10-19 DIAGNOSIS — F53 Postpartum depression: Secondary | ICD-10-CM

## 2020-10-19 HISTORY — DX: Postpartum depression: F53.0

## 2020-10-26 DIAGNOSIS — Z30433 Encounter for removal and reinsertion of intrauterine contraceptive device: Secondary | ICD-10-CM | POA: Diagnosis not present

## 2020-10-26 DIAGNOSIS — Z30431 Encounter for routine checking of intrauterine contraceptive device: Secondary | ICD-10-CM | POA: Diagnosis not present

## 2020-10-26 DIAGNOSIS — Z124 Encounter for screening for malignant neoplasm of cervix: Secondary | ICD-10-CM | POA: Diagnosis not present

## 2020-10-26 DIAGNOSIS — Z113 Encounter for screening for infections with a predominantly sexual mode of transmission: Secondary | ICD-10-CM | POA: Diagnosis not present

## 2020-10-26 DIAGNOSIS — Z6821 Body mass index (BMI) 21.0-21.9, adult: Secondary | ICD-10-CM | POA: Diagnosis not present

## 2020-10-26 DIAGNOSIS — Z01419 Encounter for gynecological examination (general) (routine) without abnormal findings: Secondary | ICD-10-CM | POA: Diagnosis not present

## 2020-10-26 DIAGNOSIS — Z01411 Encounter for gynecological examination (general) (routine) with abnormal findings: Secondary | ICD-10-CM | POA: Diagnosis not present

## 2020-11-16 DIAGNOSIS — Z975 Presence of (intrauterine) contraceptive device: Secondary | ICD-10-CM | POA: Insufficient documentation

## 2020-11-23 DIAGNOSIS — Z30431 Encounter for routine checking of intrauterine contraceptive device: Secondary | ICD-10-CM | POA: Diagnosis not present

## 2020-12-16 ENCOUNTER — Telehealth: Payer: 59 | Admitting: Physician Assistant

## 2021-02-17 ENCOUNTER — Other Ambulatory Visit: Payer: Self-pay | Admitting: Obstetrics and Gynecology

## 2021-02-17 DIAGNOSIS — Z1231 Encounter for screening mammogram for malignant neoplasm of breast: Secondary | ICD-10-CM

## 2021-02-19 ENCOUNTER — Telehealth: Payer: 59 | Admitting: Nurse Practitioner

## 2021-02-19 DIAGNOSIS — K591 Functional diarrhea: Secondary | ICD-10-CM

## 2021-02-19 DIAGNOSIS — R112 Nausea with vomiting, unspecified: Secondary | ICD-10-CM | POA: Diagnosis not present

## 2021-02-19 MED ORDER — ONDANSETRON HCL 4 MG PO TABS: 4.0000 mg | ORAL_TABLET | Freq: Three times a day (TID) | ORAL | 0 refills | Status: DC | PRN

## 2021-02-19 NOTE — Addendum Note (Signed)
Addended by: Bennie Pierini on: 02/19/2021 09:36 AM   Modules accepted: Orders

## 2021-02-19 NOTE — Progress Notes (Signed)
E-Visit for Nausea and Vomiting   We are sorry that you are not feeling well. Here is how we plan to help!  Based on what you have shared with me it looks like you have a Virus that is irritating your GI tract.  Vomiting is the forceful emptying of a portion of the stomach's content through the mouth.  Although nausea and vomiting can make you feel miserable, it's important to remember that these are not diseases, but rather symptoms of an underlying illness.  When we treat short term symptoms, we always caution that any symptoms that persist should be fully evaluated in a medical office.  I have prescribed a medication that will help alleviate your symptoms and allow you to stay hydrated:  Zofran 4 mg 1 tablet every 8 hours as needed for nausea and vomiting. You will need to use imodium AD OTC for diarrhea.  HOME CARE: Drink clear liquids.  This is very important! Dehydration (the lack of fluid) can lead to a serious complication.  Start off with 1 tablespoon every 5 minutes for 8 hours. You may begin eating bland foods after 8 hours without vomiting.  Start with saltine crackers, white bread, rice, mashed potatoes, applesauce. After 48 hours on a bland diet, you may resume a normal diet. Try to go to sleep.  Sleep often empties the stomach and relieves the need to vomit.  GET HELP RIGHT AWAY IF:  Your symptoms do not improve or worsen within 2 days after treatment. You have a fever for over 3 days. You cannot keep down fluids after trying the medication.  MAKE SURE YOU:  Understand these instructions. Will watch your condition. Will get help right away if you are not doing well or get worse.    Thank you for choosing an e-visit.  Your e-visit answers were reviewed by a board certified advanced clinical practitioner to complete your personal care plan. Depending upon the condition, your plan could have included both over the counter or prescription medications.  Please review your  pharmacy choice. Make sure the pharmacy is open so you can pick up prescription now. If there is a problem, you may contact your provider through [employer] and have the prescription routed to another pharmacy.  Your safety is important to [place]. If you have drug allergies check your prescription carefully.   For the next 24 hours you can use MyChart to ask questions about today's visit, request a non-urgent call back, or ask for a work or school excuse. You will get an email in the next two days asking about your experience. I hope that your e-visit has been valuable and will speed your recovery.  5-10 minutes spent reviewing and documenting in chart.

## 2021-02-19 NOTE — Addendum Note (Signed)
Addended by: Bennie Pierini on: 02/19/2021 10:43 AM   Modules accepted: Orders

## 2021-03-10 ENCOUNTER — Telehealth: Payer: Self-pay | Admitting: Gastroenterology

## 2021-03-10 ENCOUNTER — Ambulatory Visit: Payer: 59 | Admitting: Gastroenterology

## 2021-03-10 ENCOUNTER — Encounter: Payer: Self-pay | Admitting: Gastroenterology

## 2021-03-10 VITALS — BP 110/70 | HR 81 | Ht 63.0 in | Wt 127.0 lb

## 2021-03-10 DIAGNOSIS — K602 Anal fissure, unspecified: Secondary | ICD-10-CM

## 2021-03-10 MED ORDER — AMBULATORY NON FORMULARY MEDICATION: 1 refills | Status: DC

## 2021-03-10 NOTE — Telephone Encounter (Signed)
Once again explained how compound medications aren't always available for same day pick up. They should call and see if they have any ointment made, occasional they do, but usually this is made to order.

## 2021-03-10 NOTE — Progress Notes (Signed)
[facility] Consult Note:  History: [name] 03/10/2021  Referring provider: Excell Seltzer, MD  Reason for consult/chief complaint: ? anal fissure (Rectal bleeding, a "hole" there, a cut, painful, onset on/off x a year)   Subjective  HPI:  [name] was here with her husband [name] describing over a year of anal pain and bleeding that she believes may be a fissure.  Symptoms have been there intermittently, they will seem to heal and resolve and then recur.  She has burning and pain in the perianal area, pain with bowel movements and occasional blood on the paper.  She denies chronic abdominal pain, diarrhea, constipation, nausea, vomiting, early satiety, dysphagia or weight loss.  2 formed BMs per day on average. She has used some OTC hemorrhoid ointments and lidocaine on the perianal area without much improvement.  ROS:  Review of Systems  Constitutional:  Negative for appetite change and unexpected weight change.  HENT:  Negative for mouth sores and voice change.   Eyes:  Negative for pain and redness.  Respiratory:  Negative for cough and shortness of breath.   Cardiovascular:  Negative for chest pain and palpitations.  Genitourinary:  Negative for dysuria and hematuria.  Musculoskeletal:  Negative for arthralgias and myalgias.  Skin:  Negative for pallor and rash.  Neurological:  Negative for weakness and headaches.  Hematological:  Negative for adenopathy.    Past Medical History: Past Medical History:  Diagnosis Date   Blood transfusion    Chronic kidney disease    stones   Dermatitis    GERD (gastroesophageal reflux disease)    Herpes    oral   Renal colic    Renal stones    Somatoform disorder, unspecified    Von Willebrand disease      Past Surgical History: Past Surgical History:  Procedure Laterality Date   KIDNEY STONE SURGERY     nephrosotmy tube  2008 and 2009   TONSILLECTOMY       Family History: Family History   Problem Relation Age of Onset   Colon polyps Mother    Hypertension Father    Diabetes Father    Uterine cancer Maternal Grandmother    Breast cancer Maternal Grandmother    Coronary artery disease Maternal Grandfather    Heart disease Maternal Grandfather 60   Prostate cancer Maternal Grandfather    Coronary artery disease Paternal Grandfather    Diabetes Paternal Grandfather    Heart attack Paternal Grandfather 53   Colon cancer Neg Hx    Rectal cancer Neg Hx    Esophageal cancer Neg Hx     Social History: Social History   Socioeconomic History   Marital status: Married    Spouse name: [name]   Number of children: 3   Years of education: Not on file   Highest education level: Not on file  Occupational History   Occupation: Teacher, adult education: [employer]    Comment: work in Consulting civil engineer  Tobacco Use   Smoking status: Former    Years: 10.00    Types: Cigarettes   Smokeless tobacco: Never  Vaping Use   Vaping Use: Never used  Substance and Sexual Activity   Alcohol use: Yes    Comment: Occasional   Drug use: No   Sexual activity: Yes    Partners: Male    Birth control/protection: I.U.D.  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 Dispense 30 GM one refill 30 g 1   cyclobenzaprine (FLEXERIL) 10 MG tablet TAKE 1 TABLET BY MOUTH  ONCE DAILY AT BEDTIME (Patient taking differently: Take 10 mg by mouth daily as needed.) 30 tablet 1   levonorgestrel (MIRENA) 20 MCG/24HR IUD 1 each by Intrauterine route once.       No current facility-administered medications for this visit.      ___________________________________________________________________ Objective   Exam:  BP 110/70   Pulse 81   Ht 5\' 3"  (1.6 m)   Wt 127 lb (57.6 kg)   BMI 22.50 kg/m  Wt Readings from Last 3 Encounters:  03/10/21 127 lb (57.6 kg)  12/11/16 120 lb 4 oz (54.5 kg)  04/12/12 199 lb 12 oz (90.6 kg)   Her husband was present for the entire visit. General: Well-appearing Eyes: sclera anicteric, no redness ENT: oral mucosa moist without lesions, no cervical or supraclavicular lymphadenopathy CV: RRR without murmur, S1/S2, no JVD, no peripheral edema Resp: clear to auscultation bilaterally, normal RR and effort noted GI: soft, no tenderness, with active bowel sounds. No guarding or palpable organomegaly noted. Skin; warm and dry, no rash or jaundice noted Neuro: awake, alert and oriented x 3. Normal gross motor function and fluent speech On perianal exam, the distal end of a posterior anal fissure can be seen.  She has a short and relatively shallow palpable posterior anal fissure, I was able to complete the DRE, no palpable internal lesions.  No other perianal abnormalities. Labs:   Assessment: Encounter Diagnosis  Name Primary?   Anal fissure Yes    Anal fissure accounts for the reported symptoms, no other findings on exam.  We discussed the nature of anal fissure, she does not have chronic constipation or diarrhea that usually precedes it.  I described the usual treatment of 3 times daily use of both lidocaine and nitroglycerin ointment.  She does not appear to need MiraLAX powder at this point as her bowel habits are described as regular with soft formed stool. No localizing digestive symptoms, this does not appear to be Crohn's  disease, and exam does not suggest malignancy.  I do not think she needs a colonoscopy at this point.  I offered Analissa either a follow-up office visit in about 6 weeks or the opportunity to communicate with me by portal in that period of time.  She opted for the latter, and if she is not significantly improved by then, I will refer her to colorectal surgery.  She was comfortable with that plan   Thank you for the courtesy of this consult.  Please call me with any questions or concerns.  Tresa Endo III  CC: Referring provider noted above

## 2021-03-10 NOTE — Patient Instructions (Addendum)
If you are age 40 or older, your body mass index should be between 23-30. Your Body mass index is 22.5 kg/m. If this is out of the aforementioned range listed, please consider follow up with your Primary Care Provider.  If you are age 41 or younger, your body mass index should be between 19-25. Your Body mass index is 22.5 kg/m. If this is out of the aformentioned range listed, please consider follow up with your Primary Care Provider.   ________________________________________________________  The [facility] providers would like to encourage you to use [facility] to communicate with providers for non-urgent requests or questions.  Due to long hold times on the telephone, sending your provider a message by [facility] may be a faster and more efficient way to get a response.  Please allow 48 business hours for a response.  Please remember that this is for non-urgent requests.  _______________________________________________________ Please use Recticare OTC 3 times a day  Please start the Nitro ointment 3 times a day Send a message in 5-6 weeks with an update    [facility] information is below: Address: [address] [place], [place] [address]  [phone]  *Please DO NOT go directly from our office to pick up this medication! Give the pharmacy 1 day to process the prescription as this is compounded and takes time to make.  Patient Drug Education for Nitroglycerin Ointment  Nitroglycerin ointment (NTG) is used to help heal anal fissures. The ointment relaxes the smooth muscle around the anus and promotes blood flow which helps heal the fissure (tear). The NTG reduces anal canal pressure, which diminishes pain and spasm. We use a diluted concentration of NTG (.125%) compared to the 2% that is typically used for heart patients, and this is why you need to obtain the medication from a pharmacy which will compound your prescription.  The NTG ointment should be applied 3 times per  day, or as directed.  A pea-sized drop should be placed on the tip of your finger and then gently placed inside the anus. The finger should be inserted 1/3 - 1/2 its length and may be covered with a plastic glove or finger cot. You may use Vaseline to help coat the finger or dilute the ointment. If you are advised to mix the NTG with steroid ointment, limit the steroids to one to two weeks.  The first few applications should be taken lying down, as mild light-headedness or a brief headache may occur.  It may take several weeks for the fissure to begin healing, and you will need to continue taking the medication after resolution of your symptoms.  It is important to continue the treatment for the entire time period - up to 3 months or as directed. It takes up to two years for the healing tissue to regain the normal skin strength. You will be advised to add fiber to your diet, increase water intake to 7-8 glasses per day, take relaxing baths or sitz baths, and avoid prolonged sitting and straining on the commode. Local anesthetic ointment may be added.  Initially, the anal fissure is very inflamed, which allows more of the NTG to get into the blood. This allows for a higher incidence of the most common side effect - a headache. It is usually brief and mild, but may require Tylenol or Advil. You may dilute the NTG further with Vaseline to decrease the headaches. As the treatment progresses and the fissure begins to heal, the headaches will tend to  dissipate. Other side effects include lightheadedness, flushing, dizziness, nervousness, nausea, and vomiting. If any of these side effects persist or worsen, notify us promptly. Stop using the NTG and notify us immediately if you develop the rare side effects of severe dizziness, fainting, fast/pounding heartbeat, paleness, sweating, blurred vision, dry mouth, dark urine, bluish lips/skin/nails, unusual tiredness, severe weakness, irregular heartbeat, seizures, or  chest pain. Serious allergic reactions are unusual, but seek immediate medical attention if you develop a rash, swelling, dizziness, or trouble breathing.  Tell us if you are allergic to nitrates, have severe anemia, low blood pressure, dehydration, chronic heart failure, cardiomyopathy, recent heart attack, increased pressure in the brain, or exposure to nitrates while on the job. Do not use NTG while driving or working around machinery if you are drowsy, dizzy, have lightheadedness, or blurred vision. Limit alcoholic beverages. To minimize dizziness and lightheadedness, get up slowly when rising from a sitting or lying position. The elderly may be more prone to dizziness and falling. While there are not adequate studies to confirm the safety of NTG in pregnant or breast feeding women, it has been used without incident so far. We recommend waiting at least one hour after applying the NTG ointment before breast feeding.  Do not use NTG ointment if you are taking drugs for sexual problems [e.g., sildenafil (Viagra), tadalafil (Cialis), vardenafil (Levitra)]. Use caution before taking cough-and-cold products, diet aids, or NSAIDs preparations because they may contain ingredients that could increase your blood pressure, cause a fast heartbeat, or increase chest pain (e.g., pseudoephedrine, phenylephrine, chlorpheniramine, diphenhydramine, clemastine, ibuprofen, and naproxen). Tell us if you drink alcohol, take alteplase, migraine drugs (ergotamine), water pills/diuretics such as furosemide or hydrochlorothiazide, or other drugs for high blood pressure (beta blockers, calcium channel blockers, ACE inhibitors).  Store the NTG at room temperature and keep away from light and moisture. Close the container tightly after each use. Do not store in the bathroom. Keep away from children and pets. If you have any questions or problems please call us at  [phone].     It was a pleasure to see you today!  Thank  you for trusting me with your gastrointestinal care!

## 2021-03-10 NOTE — Telephone Encounter (Signed)
Patients husband called wanting to follow up on the ointment that is to be sent to pharmacy on [facility]. He said they live 45 mins away so there are there now needing to pick it up before they head out. Please call patient so they know its done.

## 2021-04-08 ENCOUNTER — Ambulatory Visit
Admission: RE | Admit: 2021-04-08 | Discharge: 2021-04-08 | Disposition: A | Discharge status: Home / Self Care | Payer: 59 | Source: Ambulatory Visit

## 2021-04-08 ENCOUNTER — Other Ambulatory Visit: Payer: Self-pay

## 2021-04-08 DIAGNOSIS — Z1231 Encounter for screening mammogram for malignant neoplasm of breast: Secondary | ICD-10-CM | POA: Diagnosis not present

## 2021-05-04 ENCOUNTER — Other Ambulatory Visit: Payer: Self-pay

## 2021-05-04 DIAGNOSIS — H6063 Unspecified chronic otitis externa, bilateral: Secondary | ICD-10-CM | POA: Diagnosis not present

## 2021-05-04 DIAGNOSIS — J3489 Other specified disorders of nose and nasal sinuses: Secondary | ICD-10-CM | POA: Diagnosis not present

## 2021-05-04 DIAGNOSIS — T161XXA Foreign body in right ear, initial encounter: Secondary | ICD-10-CM | POA: Diagnosis not present

## 2021-05-04 MED ORDER — CLOTRIMAZOLE-BETAMETHASONE 1-0.05 % EX CREA
TOPICAL_CREAM | CUTANEOUS | 3 refills | Status: DC
  Filled 2021-05-04: qty 15, 7d supply, fill #0

## 2021-05-04 MED ORDER — GENTAMICIN SULFATE 0.1 % EX OINT
TOPICAL_OINTMENT | CUTANEOUS | 2 refills | Status: DC
  Filled 2021-05-04: qty 30, 10d supply, fill #0

## 2021-05-05 ENCOUNTER — Other Ambulatory Visit: Payer: Self-pay

## 2021-05-19 ENCOUNTER — Other Ambulatory Visit: Payer: Self-pay

## 2021-05-19 ENCOUNTER — Telehealth: Payer: 59 | Admitting: Physician Assistant

## 2021-05-19 DIAGNOSIS — B001 Herpesviral vesicular dermatitis: Secondary | ICD-10-CM | POA: Diagnosis not present

## 2021-05-19 MED ORDER — VALACYCLOVIR HCL 1 G PO TABS
2000.0000 mg | ORAL_TABLET | Freq: Two times a day (BID) | ORAL | 0 refills | Status: AC
  Filled 2021-05-19: qty 4, 1d supply, fill #0

## 2021-05-19 NOTE — Progress Notes (Signed)

## 2021-05-19 NOTE — Progress Notes (Signed)
I have spent 5 minutes in review of e-visit questionnaire, review and updating patient chart, medical decision making and response to patient.   Tricia Pledger Cody Nickisha Hum, PA-C    

## 2021-06-03 ENCOUNTER — Other Ambulatory Visit: Payer: Self-pay

## 2021-06-03 MED FILL — Cyclobenzaprine HCl Tab 10 MG: ORAL | 30 days supply | Qty: 30 | Fill #0 | Status: AC

## 2021-07-01 DIAGNOSIS — K610 Anal abscess: Secondary | ICD-10-CM | POA: Diagnosis not present

## 2021-07-04 DIAGNOSIS — K610 Anal abscess: Secondary | ICD-10-CM | POA: Diagnosis not present

## 2021-07-10 ENCOUNTER — Encounter: Payer: Self-pay | Admitting: Emergency Medicine

## 2021-07-10 ENCOUNTER — Ambulatory Visit
Admission: EM | Admit: 2021-07-10 | Discharge: 2021-07-10 | Disposition: A | Discharge status: Home / Self Care | Payer: 59 | Attending: Emergency Medicine | Admitting: Emergency Medicine

## 2021-07-10 ENCOUNTER — Other Ambulatory Visit: Payer: Self-pay

## 2021-07-10 DIAGNOSIS — R300 Dysuria: Secondary | ICD-10-CM | POA: Diagnosis not present

## 2021-07-10 DIAGNOSIS — N3001 Acute cystitis with hematuria: Secondary | ICD-10-CM | POA: Insufficient documentation

## 2021-07-10 LAB — URINALYSIS, MICROSCOPIC (REFLEX)

## 2021-07-10 LAB — URINALYSIS, ROUTINE W REFLEX MICROSCOPIC
Bilirubin Urine: NEGATIVE
Glucose, UA: NEGATIVE mg/dL
Ketones, ur: NEGATIVE mg/dL
Nitrite: POSITIVE — AB
Protein, ur: 30 mg/dL — AB
Specific Gravity, Urine: >1.03 — ABNORMAL HIGH (ref 1.005–1.030)
pH: 5.5 (ref 5.0–8.0)

## 2021-07-10 MED ORDER — CEFTRIAXONE SODIUM 1 G IJ SOLR
1.0000 g | Freq: Once | INTRAMUSCULAR | Status: AC
  Administered 2021-07-10: 1 g via INTRAMUSCULAR

## 2021-07-10 MED ORDER — IBUPROFEN 600 MG PO TABS: 600.0000 mg | ORAL_TABLET | Freq: Four times a day (QID) | ORAL | 0 refills | Status: DC | PRN

## 2021-07-10 MED ORDER — NITROFURANTOIN MONOHYD MACRO 100 MG PO CAPS: 100.0000 mg | ORAL_CAPSULE | Freq: Two times a day (BID) | ORAL | 0 refills | Status: DC

## 2021-07-10 NOTE — Discharge Instructions (Addendum)
Take medications as prescribed Drink plenty of fluids Strain all urine to catch possible kidney stone You have been provided a specimen cup; you put the stone in this cup and take this to your primary care provider for further testing Urine cultures are pending; you may check your MyChart in the next few days for the results We will call you if any change of medication are necessary  To the ED immediately if:  You have severe pain in your back or your lower abdomen. You have a fever or chills. You have nausea or vomiting.

## 2021-07-10 NOTE — ED Provider Notes (Signed)
[facility]    CSN: [id number] Arrival date & time: 07/10/21  1529      History   Chief Complaint Chief Complaint  Patient presents with   Dysuria    HPI [name] is a 41 y.o. female.   Subjective:  [name] is a 41 y.o. female who complains of dysuria, right flank pain radiating into the groin region, urinary frequency, hematuria, suprapubic pressure, and urinary urgency for 3 days. Patient denies back pain, fever, vaginal discharge, nausea or vomiting.  Patient does not have a history of recurrent UTI.  Patient does have a history of pyelonephritis.  Patient does have a history of kidney stones in the past.  Patient does not get her menses anymore due to IUD placement.  She denies concern for STIs.  She has been taking Tylenol and ibuprofen for her symptoms.  She is drinking plenty of fluids.  The following portions of the patient's history were reviewed and updated as appropriate: allergies, current medications, past family history, past medical history, past social history, past surgical history, and problem list.     Past Medical History:  Diagnosis Date   Blood transfusion    Chronic kidney disease    stones   Dermatitis    GERD (gastroesophageal reflux disease)    Herpes    oral   Renal colic    Renal stones    Somatoform disorder, unspecified    Von Willebrand disease     Patient Active Problem List   Diagnosis Date Noted   Viral URI with cough 04/12/2012   Prediabetes 12/16/2010   Elevated transaminase level 12/16/2010   OTHER CHRONIC SINUSITIS 07/25/2010   Asthma, mild intermittent 05/23/2010   CHICKENPOX, HX OF 12/16/2009   [facility] DISEASE 08/19/2008   PYELONEPHRITIS, HX OF 08/19/2008   Renal stone 08/19/2008    Past Surgical History:  Procedure Laterality Date   KIDNEY STONE SURGERY     nephrosotmy tube  2008 and 2009   TONSILLECTOMY      OB History   No obstetric history on file.      Home Medications     Prior to Admission medications   Medication Sig Start Date End Date Taking? Authorizing Provider  ibuprofen (ADVIL) 600 MG tablet Take 1 tablet (600 mg total) by mouth every 6 (six) hours as needed. 07/10/21  Yes Enrique Sack, FNP  levonorgestrel (MIRENA) 20 MCG/24HR IUD 1 each by Intrauterine route once.     Yes [provider]  nitrofurantoin, macrocrystal-monohydrate, (MACROBID) 100 MG capsule Take 1 capsule (100 mg total) by mouth 2 (two) times daily. 07/10/21  Yes Teague Goynes, Aldona Bar, FNP  AMBULATORY NON FORMULARY MEDICATION Nitroglycerine ointment 0.125 %  Apply a pea sized amount internally three times daily. Dispense 30 GM one refill 03/10/21   Doran Stabler, MD  clotrimazole-betamethasone (LOTRISONE) cream Apply to each ear twice daily for 7 days, then once or twice weekly as needed for itching, irritation 05/04/21     gentamicin ointment (GARAMYCIN) 0.1 % Apply small amount to each nostril 2-3 times daily for 10 days. May repeat as needed. 05/04/21       Family History Family History  Problem Relation Age of Onset   Colon polyps Mother    Hypertension Father    Diabetes Father    Uterine cancer Maternal Grandmother    Breast cancer Maternal Grandmother    Coronary artery disease Maternal Grandfather    Heart disease Maternal Grandfather 22  Prostate cancer Maternal Grandfather    Coronary artery disease Paternal Grandfather    Diabetes Paternal Grandfather    Heart attack Paternal Grandfather 73   Colon cancer Neg Hx    Rectal cancer Neg Hx    Esophageal cancer Neg Hx     Social History Social History   Tobacco Use   Smoking status: Former    Years: 10.00    Types: Cigarettes   Smokeless tobacco: Never  Vaping Use   Vaping Use: Never used  Substance Use Topics   Alcohol use: Yes    Comment: Occasional   Drug use: No     Allergies   Cephalexin   Review of Systems Review of Systems   Physical Exam Triage Vital Signs ED Triage  Vitals  Enc Vitals Group     BP 07/10/21 1543 131/81     Pulse Rate 07/10/21 1543 90     Resp 07/10/21 1543 14     Temp 07/10/21 1543 98.8 F (37.1 C)     Temp Source 07/10/21 1543 Oral     SpO2 07/10/21 1543 100 %     Weight 07/10/21 1542 130 lb (59 kg)     Height 07/10/21 1542 5\' 3"  (1.6 m)     Head Circumference --      Peak Flow --      Pain Score 07/10/21 1541 5     Pain Loc --      Pain Edu? --      Excl. in [place]? --    No data found.  Updated Vital Signs BP 131/81 (BP Location: Left Arm)    Pulse 90    Temp 98.8 F (37.1 C) (Oral)    Resp 14    Ht 5\' 3"  (1.6 m)    Wt 130 lb (59 kg)    SpO2 100%    BMI 23.03 kg/m   Visual Acuity Right Eye Distance:   Left Eye Distance:   Bilateral Distance:    Right Eye Near:   Left Eye Near:    Bilateral Near:     Physical Exam   UC Treatments / Results  Labs (all labs ordered are listed, but only abnormal results are displayed) Labs Reviewed  URINALYSIS, ROUTINE W REFLEX MICROSCOPIC - Abnormal; Notable for the following components:      Result Value   Specific Gravity, Urine >1.030 (*)    Hgb urine dipstick MODERATE (*)    Protein, ur 30 (*)    Nitrite POSITIVE (*)    Leukocytes,Ua TRACE (*)    All other components within normal limits  URINALYSIS, MICROSCOPIC (REFLEX) - Abnormal; Notable for the following components:   Bacteria, UA MANY (*)    All other components within normal limits  URINE CULTURE    EKG   Radiology No results found.  Procedures Procedures (including critical care time)  Medications Ordered in UC Medications  cefTRIAXone (ROCEPHIN) injection 1 g (has no administration in time range)    Initial Impression / Assessment and Plan / UC Course  I have reviewed the triage vital signs and the nursing notes.  Pertinent labs & imaging results that were available during my care of the patient were reviewed by me and considered in my medical decision making (see chart for details).     42 year old female with history of kidney stones presenting with dysuria, right flank pain, urinary frequency, intermittent hematuria without gross blood, suprapubic pressure and urinary urgency for the past 3 days.  No  back pain, fever, vaginal discharge, nausea or vomiting.  Urinalysis suggestive of an acute UTI.  Rocephin 1 g IM given in the clinic.  Patient prescribed Macrobid and 600 mg ibuprofen.  Patient to continue taking Tylenol as needed.  She is advised to drink plenty of fluids.  She is to strain all her urine.  Specimen cup provided should she catch any stone.  Discussed indications for immediate ED evaluation.  Urine cultures pending.  Today's evaluation has revealed no signs of a dangerous process. Discussed diagnosis with patient and/or guardian. Patient and/or guardian aware of their diagnosis, possible red flag symptoms to watch out for and need for close follow up. Patient and/or guardian understands verbal and written discharge instructions. Patient and/or guardian comfortable with plan and disposition.  Patient and/or guardian has a clear mental status at this time, good insight into illness (after discussion and teaching) and has clear judgment to make decisions regarding their care  This care was provided during an unprecedented National Emergency due to the Novel Coronavirus (COVID-19) pandemic. COVID-19 infections and transmission risks place heavy strains on healthcare resources.  As this pandemic evolves, our facility, providers, and staff strive to respond fluidly, to remain operational, and to provide care relative to available resources and information. Outcomes are unpredictable and treatments are without well-defined guidelines. Further, the impact of COVID-19 on all aspects of urgent care, including the impact to patients seeking care for reasons other than COVID-19, is unavoidable during this national emergency. At this time of the global pandemic, management of patients has  significantly changed, even for non-COVID positive patients given high local and regional COVID volumes at this time requiring high healthcare system and resource utilization. The standard of care for management of both COVID suspected and non-COVID suspected patients continues to change rapidly at the local, regional, national, and global levels. This patient was worked up and treated to the best available but ever changing evidence and resources available at this current time.   Documentation was completed with the aid of voice recognition software. Transcription may contain typographical errors. Final Clinical Impressions(s) / UC Diagnoses   Final diagnoses:  Dysuria  Acute cystitis with hematuria     Discharge Instructions      Take medications as prescribed Drink plenty of fluids Strain all urine to catch possible kidney stone You have been provided a specimen cup; you put the stone in this cup and take this to your primary care provider for further testing Urine cultures are pending; you may check your MyChart in the next few days for the results We will call you if any change of medication are necessary  To the ED immediately if:  You have severe pain in your back or your lower abdomen. You have a fever or chills. You have nausea or vomiting.     ED Prescriptions     Medication Sig Dispense Auth. Provider   nitrofurantoin, macrocrystal-monohydrate, (MACROBID) 100 MG capsule Take 1 capsule (100 mg total) by mouth 2 (two) times daily. 10 capsule Enrique Sack, FNP   ibuprofen (ADVIL) 600 MG tablet Take 1 tablet (600 mg total) by mouth every 6 (six) hours as needed. 30 tablet Enrique Sack, FNP      PDMP not reviewed this encounter.   Enrique Sack, [place] 07/10/21 [phone]

## 2021-07-10 NOTE — ED Triage Notes (Signed)
Patient c/o burning when urinating, urinary frequency and urgency that started 2-3 days ago.

## 2021-07-13 LAB — URINE CULTURE: Culture: >=100000 — AB

## 2021-07-18 DIAGNOSIS — K610 Anal abscess: Secondary | ICD-10-CM | POA: Diagnosis not present

## 2021-09-09 ENCOUNTER — Ambulatory Visit: Payer: Self-pay | Admitting: Family Medicine

## 2021-10-24 DIAGNOSIS — L219 Seborrheic dermatitis, unspecified: Secondary | ICD-10-CM | POA: Insufficient documentation

## 2021-10-25 ENCOUNTER — Ambulatory Visit: Payer: 59 | Admitting: Family Medicine

## 2021-10-25 ENCOUNTER — Encounter: Payer: Self-pay | Admitting: Family Medicine

## 2021-10-25 VITALS — BP 118/82 | HR 85 | Ht 63.0 in | Wt 135.0 lb

## 2021-10-25 DIAGNOSIS — Z1159 Encounter for screening for other viral diseases: Secondary | ICD-10-CM | POA: Diagnosis not present

## 2021-10-25 DIAGNOSIS — L219 Seborrheic dermatitis, unspecified: Secondary | ICD-10-CM | POA: Diagnosis not present

## 2021-10-25 DIAGNOSIS — Z114 Encounter for screening for human immunodeficiency virus [HIV]: Secondary | ICD-10-CM

## 2021-10-25 DIAGNOSIS — Z0000 Encounter for general adult medical examination without abnormal findings: Secondary | ICD-10-CM | POA: Diagnosis not present

## 2021-10-25 DIAGNOSIS — Z13220 Encounter for screening for lipoid disorders: Secondary | ICD-10-CM

## 2021-10-25 DIAGNOSIS — Z83719 Family history of colon polyps, unspecified: Secondary | ICD-10-CM | POA: Insufficient documentation

## 2021-10-25 DIAGNOSIS — R7989 Other specified abnormal findings of blood chemistry: Secondary | ICD-10-CM

## 2021-10-25 DIAGNOSIS — N200 Calculus of kidney: Secondary | ICD-10-CM

## 2021-10-25 DIAGNOSIS — D6800 Von Willebrand disease, unspecified: Secondary | ICD-10-CM | POA: Diagnosis not present

## 2021-10-25 DIAGNOSIS — Z23 Encounter for immunization: Secondary | ICD-10-CM | POA: Diagnosis not present

## 2021-10-25 DIAGNOSIS — Z8371 Family history of colonic polyps: Secondary | ICD-10-CM

## 2021-10-25 NOTE — Assessment & Plan Note (Signed)
Chronic issue, well controlled, is established with dermatology for intermittent flares.

## 2021-10-25 NOTE — Progress Notes (Signed)
Annual Physical Exam Visit  Patient Information:  Patient ID: [name], female DOB: May 14, 1981 Age: 41 y.o. MRN: [id number]   Subjective:   CC: Annual Physical Exam  HPI:  [name] is here for their annual physical.  I reviewed the past medical history, family history, social history, surgical history, and allergies today and changes were made as necessary.  Please see the problem list section below for additional details.  Past Medical History: Past Medical History:  Diagnosis Date   Dermatitis    GERD (gastroesophageal reflux disease)    Herpes    oral   Renal stones    2008-onward   Von Willebrand disease (HCC)    age 16   Past Surgical History: Past Surgical History:  Procedure Laterality Date   KIDNEY STONE SURGERY     nephrosotmy tube  2008 and 2009   TONSILLECTOMY     Family History: Family History  Problem Relation Age of Onset   Colon polyps Mother    Hypertension Father    Diabetes Father    Uterine cancer Maternal Grandmother    Breast cancer Maternal Grandmother    Coronary artery disease Maternal Grandfather    Heart disease Maternal Grandfather 60   Prostate cancer Maternal Grandfather    Coronary artery disease Paternal Grandfather    Diabetes Paternal Grandfather    Heart attack Paternal Grandfather 19   Colon cancer Neg Hx    Rectal cancer Neg Hx    Esophageal cancer Neg Hx    Allergies: Allergies  Allergen Reactions   Cephalexin Hives and Rash    REACTION: rash REACTION: rash    Phenazopyridine Other (See Comments)   Health Maintenance: Health Maintenance  Topic Date Due   Hepatitis C Screening  Never done   INFLUENZA VACCINE  12/20/2021   PAP SMEAR-Modifier  10/06/2023   TETANUS/TDAP  10/26/2031   HIV Screening  Completed   HPV VACCINES  Aged Out   COVID-19 Vaccine  Discontinued    HM Colonoscopy     This patient has no relevant Health Maintenance data.      Medications: Current Outpatient Medications  on File Prior to Visit  Medication Sig Dispense Refill   AMBULATORY NON FORMULARY MEDICATION Nitroglycerine ointment 0.125 %  Apply a pea sized amount internally three times daily. Dispense 30 GM one refill 30 g 1   gentamicin ointment (GARAMYCIN) 0.1 % Apply small amount to each nostril 2-3 times daily for 10 days. May repeat as needed. 30 g 2   ketoconazole (NIZORAL) 2 % shampoo See admin instructions.     levonorgestrel (MIRENA) 20 MCG/DAY IUD by Intrauterine route.     No current facility-administered medications on file prior to visit.    Review of Systems: No headache, visual changes, nausea, vomiting, diarrhea, constipation, dizziness, abdominal pain, skin rash, fevers, chills, night sweats, swollen lymph nodes, weight loss, chest pain, body aches, joint swelling, muscle aches, shortness of breath, mood changes, visual or auditory hallucinations reported.  Objective:   Vitals:   10/25/21 0915  BP: 118/82  Pulse: 85  SpO2: 99%   Vitals:   10/25/21 0915  Weight: 135 lb (61.2 kg)  Height: 5\' 3"  (1.6 m)   Body mass index is 23.91 kg/m.  General: Well Developed, well nourished, and in no acute distress.  Neuro: Alert and oriented x3, extra-ocular muscles intact, sensation grossly intact. Cranial nerves II through XII are grossly intact, motor, sensory, and coordinative functions are intact. HEENT:  Normocephalic, atraumatic, pupils equal round reactive to light, neck supple, no masses, no lymphadenopathy, thyroid nonpalpable. Oropharynx, nasopharynx, external ear canals are unremarkable. Skin: Warm and dry, no rashes noted.  Cardiac: Regular rate and rhythm, no murmurs rubs or gallops. No peripheral edema. Pulses symmetric. Respiratory: Clear to auscultation bilaterally. Not using accessory muscles, speaking in full sentences.  Abdominal: Soft, nontender, nondistended, positive bowel sounds, no masses, no organomegaly. Musculoskeletal: Shoulder, elbow, wrist, hip, knee, ankle  stable, and with full range of motion.  Female chaperone initials: KH present throughout the physical examination.  Impression and Recommendations:   The patient was counselled, risk factors were discussed, and anticipatory guidance given.  Problem List Items Addressed This Visit       Musculoskeletal and Integument   Seborrheic dermatitis    Chronic issue, well controlled, is established with dermatology for intermittent flares.         Genitourinary   Renal stone    Multiple episodes over the years, has required nephrostomy.  No specific risk factors identified from prior nephrology work-up.  Encouraged adequate daily hydration as this appears to be an area to work on.         Hematopoietic and Hemostatic   Von Willebrand's disease (HCC)    Diagnosed in the setting of excessive bleeding during childhood tonsillectomy, work-up at that time.  Minimally symptomatic since with infrequent bruising.         Other   Annual physical exam - Primary    Annual examination completed, risk stratification labs ordered, anticipatory guidance provided.  We will follow labs once resulted.  Tdap administered today.       Relevant Orders   Apo A1 + B + Ratio   CBC   Hepatitis C antibody   VITAMIN D 25 Hydroxy (Vit-D Deficiency, Fractures)   TSH   Lipid panel   Comprehensive metabolic panel   HIV Antibody (routine testing w rflx)   Hemoglobin A1c   Family history of colonic polyps   Relevant Orders   Ambulatory referral to Gastroenterology   Other Visit Diagnoses     Screening for HIV (human immunodeficiency virus)       Relevant Orders   HIV Antibody (routine testing w rflx)   Need for hepatitis C screening test       Relevant Orders   Hepatitis C antibody   Screening for lipoid disorders       Relevant Orders   Apo A1 + B + Ratio   Lipid panel   Low serum vitamin D       Relevant Orders   VITAMIN D 25 Hydroxy (Vit-D Deficiency, Fractures)   Need for Tdap vaccination        Relevant Orders   Tdap vaccine greater than or equal to 7yo IM (Completed)        Orders & Medications Medications: No orders of the defined types were placed in this encounter.  Orders Placed This Encounter  Procedures   Tdap vaccine greater than or equal to 7yo IM   Apo A1 + B + Ratio   CBC   Hepatitis C antibody   VITAMIN D 25 Hydroxy (Vit-D Deficiency, Fractures)   TSH   Lipid panel   Comprehensive metabolic panel   HIV Antibody (routine testing w rflx)   Hemoglobin A1c   Ambulatory referral to Gastroenterology     Return in about 1 year (around 10/26/2022) for annual physical.    Jerrol Banana, MD   Primary Care  [place]

## 2021-10-25 NOTE — Patient Instructions (Signed)
-   Obtain fasting labs with orders provided (can have water or black coffee but otherwise no food or drink x 8 hours before labs) °- Review information provided °- Attend eye doctor annually, dentist every 6 months, work towards or maintain 30 minutes of moderate intensity physical activity at least 5 days per week, and consume a balanced diet °- Return in 1 year for physical °- Contact us for any questions between now and then °

## 2021-10-25 NOTE — Assessment & Plan Note (Signed)
Multiple episodes over the years, has required nephrostomy.  No specific risk factors identified from prior nephrology work-up.  Encouraged adequate daily hydration as this appears to be an area to work on.

## 2021-10-25 NOTE — Assessment & Plan Note (Signed)
Annual examination completed, risk stratification labs ordered, anticipatory guidance provided.  We will follow labs once resulted.  Tdap administered today. 

## 2021-10-25 NOTE — Assessment & Plan Note (Signed)
Diagnosed in the setting of excessive bleeding during childhood tonsillectomy, work-up at that time.  Minimally symptomatic since with infrequent bruising.

## 2021-10-26 LAB — COMPREHENSIVE METABOLIC PANEL
ALT: 12 IU/L (ref 0–32)
AST: 15 IU/L (ref 0–40)
Albumin/Globulin Ratio: 2.2 (ref 1.2–2.2)
Albumin: 5 g/dL — ABNORMAL HIGH (ref 3.8–4.8)
Alkaline Phosphatase: 46 IU/L (ref 44–121)
BUN/Creatinine Ratio: 11 (ref 9–23)
BUN: 8 mg/dL (ref 6–24)
Bilirubin Total: 0.7 mg/dL (ref 0.0–1.2)
CO2: 18 mmol/L — ABNORMAL LOW (ref 20–29)
Calcium: 9.8 mg/dL (ref 8.7–10.2)
Chloride: 102 mmol/L (ref 96–106)
Creatinine, Ser: 0.72 mg/dL (ref 0.57–1.00)
Globulin, Total: 2.3 g/dL (ref 1.5–4.5)
Glucose: 86 mg/dL (ref 70–99)
Potassium: 3.9 mmol/L (ref 3.5–5.2)
Sodium: 141 mmol/L (ref 134–144)
Total Protein: 7.3 g/dL (ref 6.0–8.5)
eGFR: 108 mL/min/{1.73_m2} (ref >59)

## 2021-10-26 LAB — CBC
Hematocrit: 42.5 % (ref 34.0–46.6)
Hemoglobin: 14.9 g/dL (ref 11.1–15.9)
MCH: 32.3 pg (ref 26.6–33.0)
MCHC: 35.1 g/dL (ref 31.5–35.7)
MCV: 92 fL (ref 79–97)
Platelets: 282 10*3/uL (ref 150–450)
RBC: 4.62 x10E6/uL (ref 3.77–5.28)
RDW: 12.4 % (ref 11.7–15.4)
WBC: 6.8 10*3/uL (ref 3.4–10.8)

## 2021-10-26 LAB — LIPID PANEL
Chol/HDL Ratio: 3.3 ratio (ref 0.0–4.4)
Cholesterol, Total: 179 mg/dL (ref 100–199)
HDL: 55 mg/dL (ref >39)
LDL Chol Calc (NIH): 111 mg/dL — ABNORMAL HIGH (ref 0–99)
Triglycerides: 69 mg/dL (ref 0–149)
VLDL Cholesterol Cal: 13 mg/dL (ref 5–40)

## 2021-10-26 LAB — APO A1 + B + RATIO
Apolipo. B/A-1 Ratio: 0.6 ratio (ref 0.0–0.6)
Apolipoprotein A-1: 147 mg/dL (ref 116–209)
Apolipoprotein B: 93 mg/dL — ABNORMAL HIGH (ref <90)

## 2021-10-26 LAB — HEMOGLOBIN A1C
Est. average glucose Bld gHb Est-mCnc: 94 mg/dL
Hgb A1c MFr Bld: 4.9 % (ref 4.8–5.6)

## 2021-10-26 LAB — HIV ANTIBODY (ROUTINE TESTING W REFLEX): HIV Screen 4th Generation wRfx: NONREACTIVE

## 2021-10-26 LAB — VITAMIN D 25 HYDROXY (VIT D DEFICIENCY, FRACTURES): Vit D, 25-Hydroxy: 19.9 ng/mL — ABNORMAL LOW (ref 30.0–100.0)

## 2021-10-26 LAB — TSH: TSH: 1.1 u[IU]/mL (ref 0.450–4.500)

## 2021-10-26 LAB — HEPATITIS C ANTIBODY: Hep C Virus Ab: NONREACTIVE

## 2021-10-28 ENCOUNTER — Telehealth: Payer: Self-pay | Admitting: Family Medicine

## 2021-10-28 ENCOUNTER — Other Ambulatory Visit: Payer: Self-pay

## 2021-10-28 DIAGNOSIS — R7989 Other specified abnormal findings of blood chemistry: Secondary | ICD-10-CM

## 2021-10-28 MED ORDER — VITAMIN D (ERGOCALCIFEROL) 1.25 MG (50000 UNIT) PO CAPS
50000.0000 [IU] | ORAL_CAPSULE | ORAL | 1 refills | Status: DC
  Filled 2021-10-28: qty 4, 28d supply, fill #0
  Filled 2021-11-27: qty 4, 28d supply, fill #1

## 2021-10-28 NOTE — Telephone Encounter (Signed)
Copied from CRM [id number]. Topic: General - Other >> Oct 28, 2021  3:28 PM Ja-Kwan M wrote: Reason for CRM: Pt son called to follow up on the Rx for Vitamin D. Pt son stated pt uses [facility] for her prescriptions.

## 2021-10-28 NOTE — Telephone Encounter (Signed)
Order sent in per your lab note. Please Co-sign

## 2021-11-27 ENCOUNTER — Other Ambulatory Visit: Payer: Self-pay

## 2021-11-28 ENCOUNTER — Other Ambulatory Visit: Payer: Self-pay

## 2021-12-09 NOTE — Telephone Encounter (Signed)
This encounter was created in error - please disregard.

## 2021-12-20 ENCOUNTER — Encounter: Payer: Self-pay | Admitting: Gastroenterology

## 2022-01-18 ENCOUNTER — Ambulatory Visit: Payer: 59 | Admitting: Gastroenterology

## 2022-01-25 ENCOUNTER — Other Ambulatory Visit: Payer: Self-pay

## 2022-01-25 DIAGNOSIS — H5213 Myopia, bilateral: Secondary | ICD-10-CM | POA: Diagnosis not present

## 2022-01-25 MED ORDER — TOBRAMYCIN-DEXAMETHASONE 0.3-0.1 % OP SUSP
OPHTHALMIC | 0 refills | Status: DC
  Filled 2022-01-25: qty 5, 12d supply, fill #0

## 2022-02-01 ENCOUNTER — Ambulatory Visit: Payer: 59 | Admitting: Gastroenterology

## 2022-02-01 ENCOUNTER — Encounter: Payer: Self-pay | Admitting: Gastroenterology

## 2022-02-01 VITALS — BP 118/74 | HR 103 | Ht 63.0 in | Wt 139.0 lb

## 2022-02-01 DIAGNOSIS — K602 Anal fissure, unspecified: Secondary | ICD-10-CM

## 2022-02-01 DIAGNOSIS — Z8371 Family history of colonic polyps: Secondary | ICD-10-CM

## 2022-02-01 MED ORDER — AMBULATORY NON FORMULARY MEDICATION
1 refills | Status: DC
Start: 2022-02-01 — End: 2022-10-30

## 2022-02-01 NOTE — Progress Notes (Signed)
[facility] Consult Note:  History: [name] 02/01/2022  Referring provider: Jerrol Banana, MD  Reason for consult/chief complaint: there is a family hx of colon polyps and Abdominal Pain (Here to discuss having an colonoscopy )   Subjective  HPI: [name] was here to discuss a family history of colon polyps.  I saw her once in October 2022 for chronic symptoms of an anal fissure.  She tells me things improved for a while on a combination of nitroglycerin and RectiCare ointment, and then she stopped using them and things returned.  She has just been "dealing with it", as there has been ongoing pain and bleeding but she has not used any particular therapy.  Her primary care physician thought she should talk to [place] because her father had colon polyps.  She learned from her mother that her father had adenomatous colon polyps in his late 38s. Her mother has had colon cancer screening and did not have polyps. [name] reports of bowel habits are regular   ROS:  Review of Systems Denies chest pain or dyspnea  Past Medical History: Past Medical History:  Diagnosis Date   Dermatitis    GERD (gastroesophageal reflux disease)    Herpes    oral   Renal stones    2008-onward   Von Willebrand disease (HCC)    age 50     Past Surgical History: Past Surgical History:  Procedure Laterality Date   KIDNEY STONE SURGERY     nephrosotmy tube  2008 and 2009   TONSILLECTOMY       Family History: Family History  Problem Relation Age of Onset   Colon polyps Mother    Hypertension Father    Diabetes Father    Uterine cancer Maternal Grandmother    Breast cancer Maternal Grandmother    Coronary artery disease Maternal Grandfather    Heart disease Maternal Grandfather 60   Prostate cancer Maternal Grandfather    Coronary artery disease Paternal Grandfather    Diabetes Paternal Grandfather    Heart attack Paternal Grandfather 75   Colon cancer Neg Hx     Rectal cancer Neg Hx    Esophageal cancer Neg Hx     Social History: Social History   Socioeconomic History   Marital status: Married    Spouse name: [name]   Number of children: 3   Years of education: Not on file   Highest education level: Not on file  Occupational History   Occupation: Teacher, adult education: [employer]    Comment: work in Consulting civil engineer   Occupation: IT  Tobacco Use   Smoking status: Former    Years: 10.00    Types: Cigarettes   Smokeless tobacco: Never  Building services engineer Use: Never used  Substance and Sexual Activity   Alcohol use: Not Currently   Drug use: No   Sexual activity: Yes    Partners: Male    Birth control/protection: I.U.D.  Other Topics Concern   Not on file  Social History Narrative   Regular exercise--no      Diet--fruits, veggies, water limited   Social Determinants of Health   Financial Resource Strain: Not on file  Food Insecurity: Not on file  Transportation Needs: Not on file  Physical Activity: Not on file  Stress: Not on file  Social Connections: Not on file    Allergies: Allergies  Allergen Reactions   Cephalexin Hives and Rash    REACTION: rash  REACTION: rash    Phenazopyridine Other (See Comments)    Outpatient Meds: Current Outpatient Medications  Medication Sig Dispense Refill   AMBULATORY NON FORMULARY MEDICATION Nitroglycerine ointment 0.125 %  Apply a pea sized amount internally four times daily. Dispense 30 GM 1 refill 30 g 1   gentamicin ointment (GARAMYCIN) 0.1 % Apply small amount to each nostril 2-3 times daily for 10 days. May repeat as needed. 30 g 2   levonorgestrel (MIRENA) 20 MCG/DAY IUD by Intrauterine route.     tobramycin-dexamethasone (TOBRADEX) ophthalmic solution Instill 1 drop into both eyes three times a day 5 mL 0   AMBULATORY NON FORMULARY MEDICATION Nitroglycerine ointment 0.125 %  Apply a pea sized amount internally three times daily. Dispense 30 GM one refill 30 g 1    ketoconazole (NIZORAL) 2 % shampoo See admin instructions.     Vitamin D, Ergocalciferol, (DRISDOL) 1.25 MG (50000 UNIT) CAPS capsule Take 1 capsule (50,000 Units total) by mouth every 7 (seven) days. (Patient not taking: Reported on 02/01/2022) 4 capsule 1   No current facility-administered medications for this visit.      ___________________________________________________________________ Objective   Exam:  BP 118/74   Pulse (!) 103   Ht 5\' 3"  (1.6 m)   Wt 139 lb (63 kg)   BMI 24.62 kg/m  Wt Readings from Last 3 Encounters:  02/01/22 139 lb (63 kg)  10/25/21 135 lb (61.2 kg)  07/10/21 130 lb (59 kg)   Husband present for entire visit. General: Well-appearing moist without lesions, no cervical or supraclavicular lymphadenopathy CV: Regular without murmur, no JVD, no peripheral edema Resp: clear to auscultation bilaterally, normal RR and effort noted GI: soft, no tenderness, with active bowel sounds. No guarding or palpable organomegaly noted. Perianal exam normal.  DRE incomplete due to pain and tenderness from a posterior fissure.  Labs:   Assessment: Encounter Diagnoses  Name Primary?   Anal fissure Yes   Family history of colonic polyps     Longstanding anal fissure, we resumed treatment with nitroglycerin and RectiCare ointment.  However, I recommended she consult with colorectal surgery as it seems she may need Botox for surgical therapy for this.  She was reluctant to do so but finally agreeable, I referred her to Dr. 07/12/21.  Regarding her family history of colorectal polyps, this still puts her at average risk for colorectal cancer and she can start screening at age 41.  She does not have symptoms of Crohn's disease, nor do her anal findings appear to be Crohn's fistula.  Therefore, I have not scheduled her for colonoscopy.   Thank you for the courtesy of this consult.  Please call me with any questions or concerns.  41 III  CC: Referring  provider noted above

## 2022-02-01 NOTE — Patient Instructions (Addendum)
_______________________________________________________  If you are age 41 or older, your body mass index should be between 23-30. Your Body mass index is 24.62 kg/m. If this is out of the aforementioned range listed, please consider follow up with your Primary Care Provider.  If you are age 90 or younger, your body mass index should be between 19-25. Your Body mass index is 24.62 kg/m. If this is out of the aformentioned range listed, please consider follow up with your Primary Care Provider.   ________________________________________________________  The [facility] providers would like to encourage you to use [facility] to communicate with providers for non-urgent requests or questions.  Due to long hold times on the telephone, sending your provider a message by [facility] may be a faster and more efficient way to get a response.  Please allow 48 business hours for a response.  Please remember that this is for non-urgent requests.  _______________________________________________________  We have sent a prescription for nitroglycerin 0.125% ointment to [facility]. You should apply a pea size amount to your rectum three times daily x 6-8 weeks.  Please use over the counter Recticare 3 times a day  [facility] information is below: Address: [address], [place], [place] [address]  [phone]  *Please DO NOT go directly from our office to pick up this medication! Give the pharmacy 1 day to process the prescription as this is compounded and takes time to make.   Patient Drug Education for Nitroglycerin Ointment  Nitroglycerin ointment (NTG) is used to help heal anal fissures. The ointment relaxes the smooth muscle around the anus and promotes blood flow which helps heal the fissure (tear). The NTG reduces anal canal pressure, which diminishes pain and spasm. We use a diluted concentration of NTG (.125%) compared to the 2% that is typically used for heart patients, and  this is why you need to obtain the medication from a pharmacy which will compound your prescription.  The NTG ointment should be applied 3 times per day, or as directed.  A pea-sized drop should be placed on the tip of your finger and then gently placed inside the anus. The finger should be inserted 1/3 - 1/2 its length and may be covered with a plastic glove or finger cot. You may use Vaseline to help coat the finger or dilute the ointment. If you are advised to mix the NTG with steroid ointment, limit the steroids to one to two weeks.  The first few applications should be taken lying down, as mild light-headedness or a brief headache may occur.  It may take several weeks for the fissure to begin healing, and you will need to continue taking the medication after resolution of your symptoms.  It is important to continue the treatment for the entire time period - up to 3 months or as directed. It takes up to two years for the healing tissue to regain the normal skin strength. You will be advised to add fiber to your diet, increase water intake to 7-8 glasses per day, take relaxing baths or sitz baths, and avoid prolonged sitting and straining on the commode. Local anesthetic ointment may be added.  Initially, the anal fissure is very inflamed, which allows more of the NTG to get into the blood. This allows for a higher incidence of the most common side effect - a headache. It is usually brief and mild, but may require Tylenol or Advil. You may dilute the NTG further with Vaseline to decrease the headaches.  As the treatment progresses and the fissure begins to heal, the headaches will tend to dissipate. Other side effects include lightheadedness, flushing, dizziness, nervousness, nausea, and vomiting. If any of these side effects persist or worsen, notify us promptly. Stop using the NTG and notify us immediately if you develop the rare side effects of severe dizziness, fainting, fast/pounding heartbeat,  paleness, sweating, blurred vision, dry mouth, dark urine, bluish lips/skin/nails, unusual tiredness, severe weakness, irregular heartbeat, seizures, or chest pain. Serious allergic reactions are unusual, but seek immediate medical attention if you develop a rash, swelling, dizziness, or trouble breathing.  Tell us if you are allergic to nitrates, have severe anemia, low blood pressure, dehydration, chronic heart failure, cardiomyopathy, recent heart attack, increased pressure in the brain, or exposure to nitrates while on the job. Do not use NTG while driving or working around machinery if you are drowsy, dizzy, have lightheadedness, or blurred vision. Limit alcoholic beverages. To minimize dizziness and lightheadedness, get up slowly when rising from a sitting or lying position. The elderly may be more prone to dizziness and falling. While there are not adequate studies to confirm the safety of NTG in pregnant or breast feeding women, it has been used without incident so far. We recommend waiting at least one hour after applying the NTG ointment before breast feeding.  Do not use NTG ointment if you are taking drugs for sexual problems [e.g., sildenafil (Viagra), tadalafil (Cialis), vardenafil (Levitra)]. Use caution before taking cough-and-cold products, diet aids, or NSAIDs preparations because they may contain ingredients that could increase your blood pressure, cause a fast heartbeat, or increase chest pain (e.g., pseudoephedrine, phenylephrine, chlorpheniramine, diphenhydramine, clemastine, ibuprofen, and naproxen). Tell us if you drink alcohol, take alteplase, migraine drugs (ergotamine), water pills/diuretics such as furosemide or hydrochlorothiazide, or other drugs for high blood pressure (beta blockers, calcium channel blockers, ACE inhibitors).  Store the NTG at room temperature and keep away from light and moisture. Close the container tightly after each use. Do not store in the bathroom. Keep  away from children and pets. If you have any questions or problems please call us at  [phone].   It was a pleasure to see you today!  Thank you for trusting me with your gastrointestinal care!

## 2022-02-06 ENCOUNTER — Telehealth: Payer: Self-pay

## 2022-02-06 NOTE — Telephone Encounter (Signed)
Records have been faxed to Dr Drue Flirt, will await appointment information

## 2022-02-07 NOTE — Telephone Encounter (Signed)
Dr Orville Govern office called, they do not take the patients insurance. I have made [facility] aware.She wants to do some research and call me back with some alternatives.

## 2022-02-08 ENCOUNTER — Other Ambulatory Visit: Payer: Self-pay

## 2022-02-24 ENCOUNTER — Other Ambulatory Visit: Payer: Self-pay | Admitting: Obstetrics and Gynecology

## 2022-02-24 DIAGNOSIS — Z1231 Encounter for screening mammogram for malignant neoplasm of breast: Secondary | ICD-10-CM

## 2022-03-14 DIAGNOSIS — K601 Chronic anal fissure: Secondary | ICD-10-CM | POA: Diagnosis not present

## 2022-04-06 ENCOUNTER — Other Ambulatory Visit: Payer: Self-pay | Admitting: Obstetrics and Gynecology

## 2022-04-06 DIAGNOSIS — Z1231 Encounter for screening mammogram for malignant neoplasm of breast: Secondary | ICD-10-CM

## 2022-04-20 DIAGNOSIS — Z1231 Encounter for screening mammogram for malignant neoplasm of breast: Secondary | ICD-10-CM

## 2022-05-16 ENCOUNTER — Other Ambulatory Visit: Payer: Self-pay

## 2022-05-16 ENCOUNTER — Telehealth: Payer: 59 | Admitting: Family Medicine

## 2022-05-16 DIAGNOSIS — B001 Herpesviral vesicular dermatitis: Secondary | ICD-10-CM

## 2022-05-16 MED ORDER — VALACYCLOVIR HCL 1 G PO TABS
2000.0000 mg | ORAL_TABLET | Freq: Two times a day (BID) | ORAL | 0 refills | Status: AC
  Filled 2022-05-16: qty 4, 1d supply, fill #0

## 2022-05-16 NOTE — Progress Notes (Signed)

## 2022-05-19 ENCOUNTER — Encounter: Payer: Self-pay | Admitting: Family Medicine

## 2022-05-19 ENCOUNTER — Ambulatory Visit: Payer: 59 | Admitting: Family Medicine

## 2022-05-19 ENCOUNTER — Other Ambulatory Visit: Payer: Self-pay

## 2022-05-19 VITALS — BP 128/84 | HR 92 | Temp 98.0°F | Ht 63.0 in

## 2022-05-19 DIAGNOSIS — R6889 Other general symptoms and signs: Secondary | ICD-10-CM

## 2022-05-19 DIAGNOSIS — J020 Streptococcal pharyngitis: Secondary | ICD-10-CM | POA: Diagnosis not present

## 2022-05-19 LAB — POCT RAPID STREP A (OFFICE): Rapid Strep A Screen: POSITIVE — AB

## 2022-05-19 LAB — POCT INFLUENZA A/B
Influenza A, POC: NEGATIVE
Influenza B, POC: NEGATIVE

## 2022-05-19 MED ORDER — PENICILLIN V POTASSIUM 500 MG PO TABS
500.0000 mg | ORAL_TABLET | Freq: Three times a day (TID) | ORAL | 0 refills | Status: AC
  Filled 2022-05-19: qty 30, 10d supply, fill #0

## 2022-05-19 NOTE — Patient Instructions (Addendum)
-   Take antibiotics for full course - Review information regarding supportive / symptom control - Follow-up as scheduled

## 2022-05-19 NOTE — Progress Notes (Signed)
     Primary Care / Sports Medicine Office Visit  Patient Information:  Patient ID: [name], female DOB: November 20, 1980 Age: 41 y.o. MRN: [id number]   [name] is a pleasant 41 y.o. female presenting with the following:  Chief Complaint  Patient presents with   Cough    X5 days Cough/fever 100.5/neg covid 3 times, took tylenol, headache, sore lymph nodes, fever blister, sore throat      Vitals:   05/19/22 1613 05/19/22 1618  BP: 128/84 128/84  Pulse: 92   Temp: 98 F (36.7 C)   SpO2: 99%    Vitals:   05/19/22 1613  Height: 5\' 3"  (1.6 m)   Body mass index is 24.62 kg/m.  No results found.   Independent interpretation of notes and tests performed by another provider:   None  Procedures performed:   None  Pertinent History, Exam, Impression, and Recommendations:   Problem List Items Addressed This Visit       Respiratory   Strep pharyngitis - Primary    Symptoms x 5-7 days with fever onset 5 days prior, Tmax 100.5, requiring near round-the-clock acetaminophen, noting sore throat, headache, fever blister, secondary minor cough, no shortness of air.  Examination with frank erythema throughout the oropharynx, tympanic membranes and turbinates benign, clear lung fields throughout and benign cardiac sounds.  Patient had performed multiple COVID test at home which were negative, point-of-care influenza and Streptococcus test performed with patient being strep positive.  Discussed antibiotic regimen, has tolerated penicillin in the past, 10-day course prescribed and supportive care encouraged.  She can maintain routine follow-up.      Other Visit Diagnoses     Flu-like symptoms       Relevant Orders   POCT Influenza A/B (Completed)   POCT rapid strep A (Completed)        Orders & Medications Meds ordered this encounter  Medications   penicillin v potassium (VEETID) 500 MG tablet    Sig: Take 1 tablet (500 mg total) by mouth 3 (three) times daily for  10 days.    Dispense:  30 tablet    Refill:  0   Orders Placed This Encounter  Procedures   POCT Influenza A/B   POCT rapid strep A     No follow-ups on file.     , MD, [facility]   Primary Care Sports Medicine Primary Care and Sports Medicine at [facility]

## 2022-05-19 NOTE — Assessment & Plan Note (Signed)
Symptoms x 5-7 days with fever onset 5 days prior, Tmax 100.5, requiring near round-the-clock acetaminophen, noting sore throat, headache, fever blister, secondary minor cough, no shortness of air.  Examination with frank erythema throughout the oropharynx, tympanic membranes and turbinates benign, clear lung fields throughout and benign cardiac sounds.  Patient had performed multiple COVID test at home which were negative, point-of-care influenza and Streptococcus test performed with patient being strep positive.  Discussed antibiotic regimen, has tolerated penicillin in the past, 10-day course prescribed and supportive care encouraged.  She can maintain routine follow-up.

## 2022-06-15 ENCOUNTER — Ambulatory Visit
Admission: RE | Admit: 2022-06-15 | Discharge: 2022-06-15 | Disposition: A | Discharge status: Home / Self Care | Payer: Commercial Managed Care - PPO | Source: Ambulatory Visit

## 2022-06-15 DIAGNOSIS — Z1231 Encounter for screening mammogram for malignant neoplasm of breast: Secondary | ICD-10-CM | POA: Diagnosis not present

## 2022-10-30 ENCOUNTER — Encounter: Payer: Self-pay | Admitting: Family Medicine

## 2022-10-30 ENCOUNTER — Ambulatory Visit (INDEPENDENT_AMBULATORY_CARE_PROVIDER_SITE_OTHER): Payer: Commercial Managed Care - PPO | Admitting: Family Medicine

## 2022-10-30 VITALS — BP 120/80 | HR 78 | Ht 63.0 in | Wt 142.0 lb

## 2022-10-30 DIAGNOSIS — Z13220 Encounter for screening for lipoid disorders: Secondary | ICD-10-CM

## 2022-10-30 DIAGNOSIS — E559 Vitamin D deficiency, unspecified: Secondary | ICD-10-CM

## 2022-10-30 DIAGNOSIS — Z0000 Encounter for general adult medical examination without abnormal findings: Secondary | ICD-10-CM | POA: Diagnosis not present

## 2022-10-30 NOTE — Patient Instructions (Signed)
-   Obtain fasting labs with orders provided (can have water or black coffee but otherwise no food or drink x 8 hours before labs) °- Review information provided °- Attend eye doctor annually, dentist every 6 months, work towards or maintain 30 minutes of moderate intensity physical activity at least 5 days per week, and consume a balanced diet °- Return in 1 year for physical °- Contact us for any questions between now and then °

## 2022-10-30 NOTE — Assessment & Plan Note (Signed)
Annual examination completed, risk stratification labs ordered, anticipatory guidance provided.  We will follow labs once resulted. 

## 2022-10-30 NOTE — Progress Notes (Signed)
Annual Physical Exam Visit  Patient Information:  Patient ID: [name], female DOB: 1981/05/01 Age: 42 y.o. MRN: [id number]   Subjective:   CC: Annual Physical Exam  HPI:  [name] is here for their annual physical.  I reviewed the past medical history, family history, social history, surgical history, and allergies today and changes were made as necessary.  Please see the problem list section below for additional details.  Past Medical History: Past Medical History:  Diagnosis Date   Clotting disorder (HCC)    Dermatitis    GERD (gastroesophageal reflux disease)    Herpes    oral   Postpartum depression 10/19/2020   Renal stones    2008-onward   Urolithiasis 08/19/2008   Annotation: calcium phosphate   Von Willebrand disease (HCC)    age 72   Past Surgical History: Past Surgical History:  Procedure Laterality Date   KIDNEY STONE SURGERY     nephrosotmy tube  2008 and 2009   TONSILLECTOMY     Family History: Family History  Problem Relation Age of Onset   Colon polyps Mother    Hyperlipidemia Mother    Obesity Mother    Hypertension Father    Diabetes Father    Obesity Father    Uterine cancer Maternal Grandmother    Breast cancer Maternal Grandmother    Hyperlipidemia Maternal Grandmother    Coronary artery disease Maternal Grandfather    Heart disease Maternal Grandfather 60   Prostate cancer Maternal Grandfather    Coronary artery disease Paternal Grandfather    Diabetes Paternal Grandfather    Heart attack Paternal Grandfather 57   Heart disease Paternal Grandfather    Diabetes Paternal Uncle    Colon cancer Neg Hx    Rectal cancer Neg Hx    Esophageal cancer Neg Hx    Allergies: Allergies  Allergen Reactions   Cephalexin Hives, Rash and Other (See Comments)    REACTION: rash   Phenazopyridine Other (See Comments)   Health Maintenance: Health Maintenance  Topic Date Due   INFLUENZA VACCINE  12/21/2022   PAP SMEAR-Modifier   10/06/2023   DTaP/Tdap/Td (4 - Td or Tdap) 10/26/2031   Hepatitis C Screening  Completed   HIV Screening  Completed   HPV VACCINES  Aged Out   COVID-19 Vaccine  Discontinued    HM Colonoscopy     This patient has no relevant Health Maintenance data.      Medications: Current Outpatient Medications on File Prior to Visit  Medication Sig Dispense Refill   gentamicin ointment (GARAMYCIN) 0.1 % Apply small amount to each nostril 2-3 times daily for 10 days. May repeat as needed. 30 g 2   levonorgestrel (MIRENA) 20 MCG/DAY IUD by Intrauterine route.     No current facility-administered medications on file prior to visit.    Review of Systems: No headache, visual changes, nausea, vomiting, diarrhea, constipation, dizziness, abdominal pain, skin rash, fevers, chills, night sweats, swollen lymph nodes, weight loss, chest pain, body aches, joint swelling, muscle aches, shortness of breath, mood changes, visual or auditory hallucinations reported.  Objective:   Vitals:   10/30/22 0905  BP: 120/80  Pulse: 78  SpO2: 98%   Vitals:   10/30/22 0905  Weight: 142 lb (64.4 kg)  Height: 5\' 3"  (1.6 m)   Body mass index is 25.15 kg/m.  General: Well Developed, well nourished, and in no acute distress.  Neuro: Alert and oriented x3, extra-ocular muscles intact, sensation grossly  intact. Cranial nerves II through XII are grossly intact, motor, sensory, and coordinative functions are intact. Diminished symmetric reflexes bilaterally. HEENT: Normocephalic, atraumatic, pupils equal round reactive to light, neck supple, no masses, no lymphadenopathy, thyroid nonpalpable. Oropharynx, nasopharynx, external ear canals are unremarkable. Skin: Warm and dry, no rashes noted.  Cardiac: Regular rate and rhythm, no murmurs rubs or gallops. No peripheral edema. Pulses symmetric. Respiratory: Clear to auscultation bilaterally. Not using accessory muscles, speaking in full sentences.  Abdominal: Soft,  nontender, nondistended, positive bowel sounds, no masses, no organomegaly. Musculoskeletal: Shoulder, elbow, wrist, hip, knee, ankle stable, and with full range of motion.  Female chaperone initials: KG present throughout the physical examination.  Impression and Recommendations:   The patient was counselled, risk factors were discussed, and anticipatory guidance given.  Problem List Items Addressed This Visit       Other   Healthcare maintenance - Primary    Annual examination completed, risk stratification labs ordered, anticipatory guidance provided.  We will follow labs once resulted.      Relevant Orders   CBC   Comprehensive metabolic panel   Lipid panel   TSH   VITAMIN D 25 Hydroxy (Vit-D Deficiency, Fractures)   Apo A1 + B + Ratio   Hemoglobin A1c   Other Visit Diagnoses     Vitamin D deficiency       Relevant Orders   VITAMIN D 25 Hydroxy (Vit-D Deficiency, Fractures)   Screening for lipoid disorders       Relevant Orders   Comprehensive metabolic panel   Lipid panel        Orders & Medications Medications: No orders of the defined types were placed in this encounter.  Orders Placed This Encounter  Procedures   CBC   Comprehensive metabolic panel   Lipid panel   TSH   VITAMIN D 25 Hydroxy (Vit-D Deficiency, Fractures)   Apo A1 + B + Ratio   Hemoglobin A1c     Return in about 1 year (around 10/30/2023) for CPE.    Jerrol Banana, MD, [facility]   Primary Care Sports Medicine Primary Care and Sports Medicine at [facility]

## 2022-10-31 ENCOUNTER — Other Ambulatory Visit: Payer: Self-pay | Admitting: Family Medicine

## 2022-10-31 ENCOUNTER — Encounter: Payer: Self-pay | Admitting: Family Medicine

## 2022-10-31 ENCOUNTER — Other Ambulatory Visit (HOSPITAL_COMMUNITY): Payer: Self-pay

## 2022-10-31 DIAGNOSIS — E559 Vitamin D deficiency, unspecified: Secondary | ICD-10-CM

## 2022-10-31 LAB — LIPID PANEL
Chol/HDL Ratio: 2.5 ratio (ref 0.0–4.4)
Cholesterol, Total: 140 mg/dL (ref 100–199)
HDL: 57 mg/dL (ref >39)
LDL Chol Calc (NIH): 69 mg/dL (ref 0–99)
Triglycerides: 71 mg/dL (ref 0–149)
VLDL Cholesterol Cal: 14 mg/dL (ref 5–40)

## 2022-10-31 LAB — COMPREHENSIVE METABOLIC PANEL
ALT: 14 IU/L (ref 0–32)
AST: 15 IU/L (ref 0–40)
Albumin/Globulin Ratio: 2.4
Albumin: 4.3 g/dL (ref 3.9–4.9)
Alkaline Phosphatase: 50 IU/L (ref 44–121)
BUN/Creatinine Ratio: 14 (ref 9–23)
BUN: 10 mg/dL (ref 6–24)
Bilirubin Total: 0.5 mg/dL (ref 0.0–1.2)
CO2: 23 mmol/L (ref 20–29)
Calcium: 9.3 mg/dL (ref 8.7–10.2)
Chloride: 105 mmol/L (ref 96–106)
Creatinine, Ser: 0.7 mg/dL (ref 0.57–1.00)
Globulin, Total: 1.8 g/dL (ref 1.5–4.5)
Glucose: 94 mg/dL (ref 70–99)
Potassium: 3.9 mmol/L (ref 3.5–5.2)
Sodium: 139 mmol/L (ref 134–144)
Total Protein: 6.1 g/dL (ref 6.0–8.5)
eGFR: 111 mL/min/{1.73_m2} (ref >59)

## 2022-10-31 LAB — HEMOGLOBIN A1C
Est. average glucose Bld gHb Est-mCnc: 100 mg/dL
Hgb A1c MFr Bld: 5.1 % (ref 4.8–5.6)

## 2022-10-31 LAB — CBC
Hematocrit: 39.1 % (ref 34.0–46.6)
Hemoglobin: 13.4 g/dL (ref 11.1–15.9)
MCH: 31.8 pg (ref 26.6–33.0)
MCHC: 34.3 g/dL (ref 31.5–35.7)
MCV: 93 fL (ref 79–97)
Platelets: 288 10*3/uL (ref 150–450)
RBC: 4.22 x10E6/uL (ref 3.77–5.28)
RDW: 12.2 % (ref 11.7–15.4)
WBC: 7.1 10*3/uL (ref 3.4–10.8)

## 2022-10-31 LAB — APO A1 + B + RATIO
Apolipo. B/A-1 Ratio: 0.4 ratio (ref 0.0–0.6)
Apolipoprotein A-1: 147 mg/dL (ref 116–209)
Apolipoprotein B: 59 mg/dL (ref <90)

## 2022-10-31 LAB — VITAMIN D 25 HYDROXY (VIT D DEFICIENCY, FRACTURES): Vit D, 25-Hydroxy: 23.4 ng/mL — ABNORMAL LOW (ref 30.0–100.0)

## 2022-10-31 LAB — TSH: TSH: 1.14 u[IU]/mL (ref 0.450–4.500)

## 2022-10-31 MED ORDER — VITAMIN D (ERGOCALCIFEROL) 1.25 MG (50000 UNIT) PO CAPS
50000.0000 [IU] | ORAL_CAPSULE | ORAL | 0 refills | Status: DC
  Filled 2022-10-31 – 2022-11-02 (×2): qty 8, 56d supply, fill #0

## 2022-11-02 ENCOUNTER — Other Ambulatory Visit: Payer: Self-pay

## 2022-11-02 ENCOUNTER — Other Ambulatory Visit (HOSPITAL_COMMUNITY): Payer: Self-pay

## 2022-12-29 ENCOUNTER — Other Ambulatory Visit: Payer: Self-pay

## 2023-01-24 ENCOUNTER — Other Ambulatory Visit: Payer: Self-pay

## 2023-01-24 ENCOUNTER — Telehealth: Payer: Commercial Managed Care - PPO | Admitting: Physician Assistant

## 2023-01-24 DIAGNOSIS — B001 Herpesviral vesicular dermatitis: Secondary | ICD-10-CM

## 2023-01-24 MED ORDER — VALACYCLOVIR HCL 1 G PO TABS
2000.0000 mg | ORAL_TABLET | Freq: Two times a day (BID) | ORAL | 0 refills | Status: AC
  Filled 2023-01-24: qty 4, 1d supply, fill #0

## 2023-01-24 NOTE — Progress Notes (Signed)
We are sorry that you are not feeling well.  Here is how we plan to help! ° °Based on what you have shared with me it does look like you have a viral infection.   ° °Most cold sores or fever blisters are small fluid filled blisters around the mouth caused by herpes simplex virus.  The most common strain of the virus causing cold sores is herpes simplex virus 1.  It can be spread by skin contact, sharing eating utensils, or even sharing towels.  Cold sores are contagious to other people until dry. (Approximately 5-7 days).  Wash your hands. You can spread the virus to your eyes through handling your contact lenses after touching the lesions. ° °Most people experience pain at the sight or tingling sensations in their lips that may begin before the ulcers erupt. ° °Herpes simplex is treatable but not curable.  It may lie dormant for a long time and then reappear due to stress or prolonged sun exposure.  Many patients have success in treating their cold sores with an over the counter topical called Abreva.  You may apply the cream up to 5 times daily (maximum 10 days) until healing occurs. ° °If you would like to use an oral antiviral medication to speed the healing of your cold sore, I have sent a prescription to your local pharmacy Valacyclovir 2 gm take one by mouth twice a day for 1 day   ° °HOME CARE: ° °Wash your hands frequently. °Do not pick at or rub the sore. °Don't open the blisters. °Avoid kissing other people during this time. °Avoid sharing drinking glasses, eating utensils, or razors. °Do not handle contact lenses unless you have thoroughly washed your hands with soap and warm water! °Avoid oral sex during this time.  Herpes from sores on your mouth can spread to your partner's genital area. °Avoid contact with anyone who has eczema or a weakened immune system. °Cold sores are often triggered by exposure to intense sunlight, use a lip balm containing a sunscreen (SPF 30 or higher). ° °GET HELP RIGHT AWAY  IF: ° °Blisters look infected. °Blisters occur near or in the eye. °Symptoms last longer than 10 days. °Your symptoms become worse. ° °MAKE SURE YOU: ° °Understand these instructions. °Will watch your condition. °Will get help right away if you are not doing well or get worse. ° °  °Your e-visit answers were reviewed by a board certified advanced clinical practitioner to complete your personal care plan.  Depending upon the condition, your plan could have  Included both over the counter or prescription medications.   ° °Please review your pharmacy choice.  Be sure that the pharmacy you have chosen is open so that you can pick up your prescription now.  If there is a problem you can message your provider in MyChart to have the prescription routed to another pharmacy.   ° °Your safety is important to us.  If you have drug allergies check our prescription carefully. ° °For the next 24 hours you can use MyChart to ask questions about today's visit, request a non-urgent call back, or ask for a work or school excuse from your e-visit provider. ° °You will get an email in the next two days asking about your experience.  I hope that your e-visit has been valuable and will speed your recovery. ° °

## 2023-01-24 NOTE — Progress Notes (Signed)
I have spent 5 minutes in review of e-visit questionnaire, review and updating patient chart, medical decision making and response to patient.   William Cody Martin, PA-C    

## 2023-02-09 ENCOUNTER — Other Ambulatory Visit: Payer: Self-pay

## 2023-02-09 MED ORDER — COMIRNATY 30 MCG/0.3ML IM SUSY
0.3000 mL | PREFILLED_SYRINGE | Freq: Once | INTRAMUSCULAR | 0 refills | Status: AC
  Filled 2023-02-09: qty 0.3, 1d supply, fill #0

## 2023-03-02 DIAGNOSIS — H524 Presbyopia: Secondary | ICD-10-CM | POA: Diagnosis not present

## 2023-03-02 DIAGNOSIS — H52223 Regular astigmatism, bilateral: Secondary | ICD-10-CM | POA: Diagnosis not present

## 2023-03-02 DIAGNOSIS — H5213 Myopia, bilateral: Secondary | ICD-10-CM | POA: Diagnosis not present

## 2023-05-03 ENCOUNTER — Other Ambulatory Visit: Payer: Self-pay | Admitting: Obstetrics and Gynecology

## 2023-05-03 ENCOUNTER — Other Ambulatory Visit: Payer: Self-pay | Admitting: Family Medicine

## 2023-05-03 DIAGNOSIS — Z1231 Encounter for screening mammogram for malignant neoplasm of breast: Secondary | ICD-10-CM

## 2023-06-18 ENCOUNTER — Ambulatory Visit
Admission: RE | Admit: 2023-06-18 | Discharge: 2023-06-18 | Disposition: A | Discharge status: Home / Self Care | Payer: Commercial Managed Care - PPO | Source: Ambulatory Visit

## 2023-06-18 DIAGNOSIS — Z1231 Encounter for screening mammogram for malignant neoplasm of breast: Secondary | ICD-10-CM

## 2023-08-10 ENCOUNTER — Telehealth: Admitting: Student

## 2023-08-10 ENCOUNTER — Other Ambulatory Visit: Payer: Self-pay

## 2023-08-10 VITALS — Ht 63.0 in

## 2023-08-10 DIAGNOSIS — B001 Herpesviral vesicular dermatitis: Secondary | ICD-10-CM | POA: Insufficient documentation

## 2023-08-10 DIAGNOSIS — S0032XA Blister (nonthermal) of nose, initial encounter: Secondary | ICD-10-CM

## 2023-08-10 MED ORDER — VALACYCLOVIR HCL 1 G PO TABS
1000.0000 mg | ORAL_TABLET | Freq: Two times a day (BID) | ORAL | 1 refills | Status: AC
  Filled 2023-08-10 (×2): qty 14, 7d supply, fill #0
  Filled 2023-08-24: qty 14, 7d supply, fill #1

## 2023-08-10 MED ORDER — VALACYCLOVIR HCL 1 G PO TABS
1000.0000 mg | ORAL_TABLET | Freq: Two times a day (BID) | ORAL | 1 refills | Status: DC
  Filled 2023-08-10: qty 20, 10d supply, fill #0

## 2023-08-10 NOTE — Progress Notes (Addendum)
   Virtual Visit via Video Note  I connected with [name] on 08/10/23 at  4:00 PM EDT by a video enabled telemedicine application and verified that I am speaking with the correct person using two identifiers.  Patient Location: Home Provider Location: Office/Clinic  I discussed the limitations, risks, security, and privacy concerns of performing an evaluation and management service by video and the availability of in person appointments. I also discussed with the patient that there may be a patient responsible charge related to this service. The patient expressed understanding and agreed to proceed.  Subjective: PCP: Jerrol Banana, MD  Chief Complaint  Patient presents with   Mouth Lesions    X 3 days, getting worse, hasn't happened before, on the inside of noise, painful, numbness and tingling, blister (cluster)   [name] is a 67 year older person who presents for 2 days of blisters in the tip of the left nostril. This is painful and tingling. She has had a history similar lesions around the nose and philtrum in the past. Somewhat stressed with recent work trip. Otherwise feels well without fever, chills, or vision symptoms.   ROS: Per HPI  Current Outpatient Medications:    levonorgestrel (MIRENA) 20 MCG/DAY IUD, by Intrauterine route., Disp: , Rfl:   Observations/Objective: Today's Vitals   08/10/23 1551  Height: 5\' 3"  (1.6 m)   Physical Exam Constitutional:      General: She is not in acute distress.    Appearance: Normal appearance.  HENT:     Head: Normocephalic and atraumatic.     Nose:     Comments: Unable to visualize into nares on video  Eyes:     Extraocular Movements: Extraocular movements intact.     Conjunctiva/sclera: Conjunctivae normal.     Pupils: Pupils are equal, round, and reactive to light.  Neurological:     General: No focal deficit present.     Mental Status: She is alert and oriented to person, place, and time. Mental status is at  baseline.     Assessment and Plan: There are no diagnoses linked to this encounter.  Follow Up Instructions: No follow-ups on file.   I discussed the assessment and treatment plan with the patient. The patient was provided an opportunity to ask questions, and all were answered. The patient agreed with the plan and demonstrated an understanding of the instructions.   The patient was advised to call back or seek an in-person evaluation if the symptoms worsen or if the condition fails to improve as anticipated.  The above assessment and management plan was discussed with the patient. The patient verbalized understanding of and has agreed to the management plan.   [name], MD  I spent 15 minutes dedicated to the care of this patient via video on the date of this encounter, face to face time with the patient, and post visit ordering of medication.

## 2023-08-10 NOTE — Assessment & Plan Note (Signed)
 Vesicular lesion of the right nare for 2 days. Symptoms similar to prior flares. Has had good response of valacyclovir in the past. Start valacyclovir, follow up if not improving or symptoms worsen. Refill given should she have a future flare.

## 2023-10-02 DIAGNOSIS — Z1151 Encounter for screening for human papillomavirus (HPV): Secondary | ICD-10-CM | POA: Diagnosis not present

## 2023-10-02 DIAGNOSIS — Z6825 Body mass index (BMI) 25.0-25.9, adult: Secondary | ICD-10-CM | POA: Diagnosis not present

## 2023-10-02 DIAGNOSIS — Z124 Encounter for screening for malignant neoplasm of cervix: Secondary | ICD-10-CM | POA: Diagnosis not present

## 2023-10-02 DIAGNOSIS — Z01419 Encounter for gynecological examination (general) (routine) without abnormal findings: Secondary | ICD-10-CM | POA: Diagnosis not present

## 2023-11-01 ENCOUNTER — Encounter: Payer: Self-pay | Admitting: Family Medicine

## 2023-11-02 ENCOUNTER — Encounter: Admitting: Family Medicine

## 2023-12-18 ENCOUNTER — Ambulatory Visit (INDEPENDENT_AMBULATORY_CARE_PROVIDER_SITE_OTHER): Admitting: Family Medicine

## 2023-12-18 ENCOUNTER — Encounter: Payer: Self-pay | Admitting: Family Medicine

## 2023-12-18 VITALS — BP 108/76 | HR 106 | Ht 63.0 in | Wt 138.0 lb

## 2023-12-18 DIAGNOSIS — Z0000 Encounter for general adult medical examination without abnormal findings: Secondary | ICD-10-CM

## 2023-12-18 DIAGNOSIS — Z136 Encounter for screening for cardiovascular disorders: Secondary | ICD-10-CM | POA: Diagnosis not present

## 2023-12-18 DIAGNOSIS — E559 Vitamin D deficiency, unspecified: Secondary | ICD-10-CM

## 2023-12-18 DIAGNOSIS — R7309 Other abnormal glucose: Secondary | ICD-10-CM

## 2023-12-18 DIAGNOSIS — G479 Sleep disorder, unspecified: Secondary | ICD-10-CM | POA: Diagnosis not present

## 2023-12-19 NOTE — Patient Instructions (Signed)
 -  Obtain fasting labs with orders provided (can have water or black coffee but otherwise no food or drink x 8 hours before labs) - Review information provided - Attend eye doctor annually, dentist every 6 months, work towards or maintain 30 minutes of moderate intensity physical activity at least 5 days per week, and consume a balanced diet - Return in 1 year for physical - Contact us for any questions between now and then

## 2023-12-19 NOTE — Assessment & Plan Note (Signed)
 Annual examination completed, risk stratification labs ordered, anticipatory guidance provided.  We will follow labs once resulted.

## 2023-12-19 NOTE — Progress Notes (Signed)
 Annual Physical Exam Visit  Patient Information:  Patient ID: [name], female DOB: 05/13/1981 Age: 43 y.o. MRN: [id number]   Subjective:   CC: Annual Physical Exam  HPI:  [name] is here for their annual physical.  I reviewed the past medical history, family history, social history, surgical history, and allergies today and changes were made as necessary.  Please see the problem list section below for additional details.  Past Medical History: Past Medical History:  Diagnosis Date   Clotting disorder (HCC)    Dermatitis    GERD (gastroesophageal reflux disease)    Herpes    oral   Postpartum depression 10/19/2020   Renal stones    2008-onward   Urolithiasis 08/19/2008   Annotation: calcium phosphate   Von Willebrand disease (HCC)    age 69   Past Surgical History: Past Surgical History:  Procedure Laterality Date   KIDNEY STONE SURGERY     nephrosotmy tube  2008 and 2009   TONSILLECTOMY     Family History: Family History  Problem Relation Age of Onset   Colon polyps Mother    Hyperlipidemia Mother    Obesity Mother    Hypertension Father    Diabetes Father    Obesity Father    Uterine cancer Maternal Grandmother    Breast cancer Maternal Grandmother    Hyperlipidemia Maternal Grandmother    Coronary artery disease Maternal Grandfather    Heart disease Maternal Grandfather 60   Prostate cancer Maternal Grandfather    Coronary artery disease Paternal Grandfather    Diabetes Paternal Grandfather    Heart attack Paternal Grandfather 41   Heart disease Paternal Grandfather    Diabetes Paternal Uncle    Colon cancer Neg Hx    Rectal cancer Neg Hx    Esophageal cancer Neg Hx    Allergies: Allergies  Allergen Reactions   Cephalexin Hives, Other (See Comments) and Rash    REACTION: rash  Keflex   Phenazopyridine Other (See Comments)   Health Maintenance: Health Maintenance  Topic Date Due   Pneumococcal Vaccine 66-54 Years old (1 of  2 - PCV) Never done   Hepatitis B Vaccines (1 of 3 - 19+ 3-dose series) Never done   Cervical Cancer Screening (HPV/Pap Cotest)  06/23/2011   HPV VACCINES (1 - 3-dose SCDM series) 12/17/2024 (Originally 04/03/2008)   INFLUENZA VACCINE  12/21/2023   DTaP/Tdap/Td (4 - Td or Tdap) 10/26/2031   Hepatitis C Screening  Completed   HIV Screening  Completed   Meningococcal B Vaccine  Aged Out   COVID-19 Vaccine  Discontinued    HM Colonoscopy   This patient has no relevant Health Maintenance data.    Medications: Current Outpatient Medications on File Prior to Visit  Medication Sig Dispense Refill   levonorgestrel (MIRENA) 20 MCG/DAY IUD by Intrauterine route.     No current facility-administered medications on file prior to visit.    Objective:   Vitals:   12/18/23 1020  BP: 108/76  Pulse: (!) 106  SpO2: 99%   Vitals:   12/18/23 1020  Weight: 138 lb (62.6 kg)  Height: 5' 3 (1.6 m)   Body mass index is 24.45 kg/m.  General: Well Developed, well nourished, and in no acute distress.  Neuro: Alert and oriented x3, extra-ocular muscles intact, sensation grossly intact. Cranial nerves II through XII are grossly intact, motor, sensory, and coordinative functions are intact. HEENT: Normocephalic, atraumatic, neck supple, no masses, no lymphadenopathy, thyroid  nonenlarged.  Oropharynx, nasopharynx, external ear canals are unremarkable. Skin: Warm and dry, no rashes noted.  Cardiac: Regular rate and rhythm, no murmurs rubs or gallops. No peripheral edema. Pulses symmetric. Respiratory: Clear to auscultation bilaterally. Speaking in full sentences.  Abdominal: Soft, nontender, nondistended, positive bowel sounds, no masses, no organomegaly. Musculoskeletal: Stable, and with full range of motion.  Impression and Recommendations:   The patient was counselled, risk factors were discussed, and anticipatory guidance given.  Problem List Items Addressed This Visit     Healthcare  maintenance - Primary   Annual examination completed, risk stratification labs ordered, anticipatory guidance provided.  We will follow labs once resulted.      Relevant Orders   CBC   Other Visit Diagnoses       Screening for cardiovascular condition       Relevant Orders   Comprehensive metabolic panel with GFR   Lipid panel     Vitamin D  deficiency       Relevant Orders   VITAMIN D  25 Hydroxy (Vit-D Deficiency, Fractures)     Abnormal glucose       Relevant Orders   Hemoglobin A1c     Sleep disturbance       Relevant Orders   Ambulatory referral to Sleep Studies        Orders & Medications Medications: No orders of the defined types were placed in this encounter.  Orders Placed This Encounter  Procedures   CBC   Comprehensive metabolic panel with GFR   Hemoglobin A1c   Lipid panel   VITAMIN D  25 Hydroxy (Vit-D Deficiency, Fractures)   Ambulatory referral to Sleep Studies     No follow-ups on file.    [name], MD, [facility]   Primary Care Sports Medicine Primary Care and Sports Medicine at [facility]

## 2023-12-20 ENCOUNTER — Telehealth: Payer: Self-pay | Admitting: Sleep Medicine

## 2023-12-20 NOTE — Telephone Encounter (Signed)
 LVMTCB to schedule sleep consult.

## 2023-12-21 DIAGNOSIS — E559 Vitamin D deficiency, unspecified: Secondary | ICD-10-CM | POA: Diagnosis not present

## 2023-12-21 DIAGNOSIS — Z0000 Encounter for general adult medical examination without abnormal findings: Secondary | ICD-10-CM | POA: Diagnosis not present

## 2023-12-21 DIAGNOSIS — R7309 Other abnormal glucose: Secondary | ICD-10-CM | POA: Diagnosis not present

## 2023-12-21 DIAGNOSIS — Z136 Encounter for screening for cardiovascular disorders: Secondary | ICD-10-CM | POA: Diagnosis not present

## 2023-12-22 LAB — COMPREHENSIVE METABOLIC PANEL WITH GFR
ALT: 9 IU/L (ref 0–32)
AST: 14 IU/L (ref 0–40)
Albumin: 4.7 g/dL (ref 3.9–4.9)
Alkaline Phosphatase: 54 IU/L (ref 44–121)
BUN/Creatinine Ratio: 12 (ref 9–23)
BUN: 11 mg/dL (ref 6–24)
Bilirubin Total: 0.7 mg/dL (ref 0.0–1.2)
CO2: 20 mmol/L (ref 20–29)
Calcium: 9.7 mg/dL (ref 8.7–10.2)
Chloride: 106 mmol/L (ref 96–106)
Creatinine, Ser: 0.95 mg/dL (ref 0.57–1.00)
Globulin, Total: 2.3 g/dL (ref 1.5–4.5)
Glucose: 79 mg/dL (ref 70–99)
Potassium: 4.1 mmol/L (ref 3.5–5.2)
Sodium: 142 mmol/L (ref 134–144)
Total Protein: 7 g/dL (ref 6.0–8.5)
eGFR: 77 mL/min/1.73 (ref >59)

## 2023-12-22 LAB — CBC
Hematocrit: 43.6 % (ref 34.0–46.6)
Hemoglobin: 14.3 g/dL (ref 11.1–15.9)
MCH: 31.6 pg (ref 26.6–33.0)
MCHC: 32.8 g/dL (ref 31.5–35.7)
MCV: 97 fL (ref 79–97)
Platelets: 310 x10E3/uL (ref 150–450)
RBC: 4.52 x10E6/uL (ref 3.77–5.28)
RDW: 12.4 % (ref 11.7–15.4)
WBC: 7.3 x10E3/uL (ref 3.4–10.8)

## 2023-12-22 LAB — LIPID PANEL
Chol/HDL Ratio: 3.1 ratio (ref 0.0–4.4)
Cholesterol, Total: 160 mg/dL (ref 100–199)
HDL: 51 mg/dL (ref >39)
LDL Chol Calc (NIH): 96 mg/dL (ref 0–99)
Triglycerides: 67 mg/dL (ref 0–149)
VLDL Cholesterol Cal: 13 mg/dL (ref 5–40)

## 2023-12-22 LAB — VITAMIN D 25 HYDROXY (VIT D DEFICIENCY, FRACTURES): Vit D, 25-Hydroxy: 43.7 ng/mL (ref 30.0–100.0)

## 2023-12-22 LAB — HEMOGLOBIN A1C
Est. average glucose Bld gHb Est-mCnc: 91 mg/dL
Hgb A1c MFr Bld: 4.8 % (ref 4.8–5.6)

## 2023-12-27 NOTE — Telephone Encounter (Signed)
 LVMTCB to schedule sleep consult.

## 2023-12-31 ENCOUNTER — Ambulatory Visit: Payer: Self-pay | Admitting: Family Medicine

## 2024-05-07 ENCOUNTER — Other Ambulatory Visit: Payer: Self-pay

## 2024-05-11 ENCOUNTER — Telehealth: Admitting: Physician Assistant

## 2024-05-11 DIAGNOSIS — Z20828 Contact with and (suspected) exposure to other viral communicable diseases: Secondary | ICD-10-CM | POA: Diagnosis not present

## 2024-05-11 DIAGNOSIS — R6889 Other general symptoms and signs: Secondary | ICD-10-CM

## 2024-05-11 MED ORDER — BENZONATATE 100 MG PO CAPS
100.0000 mg | ORAL_CAPSULE | Freq: Three times a day (TID) | ORAL | 0 refills | Status: DC | PRN
  Filled 2024-05-11 – 2024-05-12 (×2): qty 30, 10d supply, fill #0

## 2024-05-11 MED ORDER — XOFLUZA (40 MG DOSE) 1 X 40 MG PO TBPK
40.0000 mg | ORAL_TABLET | Freq: Once | ORAL | 0 refills | Status: AC
  Filled 2024-05-11: qty 1, 1d supply, fill #0

## 2024-05-11 NOTE — Progress Notes (Signed)
 E visit for Flu like symptoms   We are sorry that you are not feeling well.  Here is how we plan to help! Based on what you have shared with me it looks like you may have a respiratory virus that may be influenza.  Influenza or the flu is  an infection caused by a respiratory virus. The flu virus is highly contagious and persons who did not receive their yearly flu vaccination may catch the flu from close contact.  We have anti-viral medications to treat the viruses that cause this infection. They are not a cure and only shorten the course of the infection. These prescriptions are most effective when they are given within the first 2 days of flu symptoms. Antiviral medications are indicated if you have a high risk of complications from the flu. You should  also consider an antiviral medication if you are in close contact with someone who is at risk. These medications can help patients avoid complications from the flu but have side effects that you should know.   Possible side effects from Tamiflu  or oseltamivir  include nausea, vomiting, diarrhea, dizziness, headaches, eye redness, sleep problems or other respiratory symptoms. You should not take Tamiflu  if you have an allergy to oseltamivir  or any to the ingredients in Tamiflu .  Based upon your symptoms and potential risk factors I have prescribed Xofluza  (Baloxavir) 20mg  tabs (2 tablets) once by mouth (if you a weigh between  88 pounds and 176 pounds- total of 40mg ).   For nasal congestion, you may use an oral decongestant such as Mucinex D or if you have glaucoma or high blood pressure use plain Mucinex.  Saline nasal spray or nasal drops can help and can safely be used as often as needed for congestion.  If you have a sore or scratchy throat, use a saltwater gargle-  to  teaspoon of salt dissolved in a 4-ounce to 8-ounce glass of warm water.  Gargle the solution for approximately 15-30 seconds and then spit.  It is important not to  swallow the solution.  You can also use throat lozenges/cough drops and Chloraseptic spray to help with throat pain or discomfort.  Warm or cold liquids can also be helpful in relieving throat pain.  For headache, pain or general discomfort, you can use Ibuprofen  or Tylenol as directed.   Some authorities believe that zinc sprays or the use of Echinacea may shorten the course of your symptoms.  I have prescribed the following medications to help lessen symptoms: I have prescribed Tessalon  Perles 100 mg. You may take 1-2 capsules every 8 hours as needed for cough  You are to isolate at home until you have been fever-free for at least 24 hours without a fever-reducing medication, and symptoms have been steadily improving for 24 hours.  If you must be around other household members who do not have symptoms, you need to make sure that both you and the family members are masking consistently with a high-quality mask.  If you note any worsening of symptoms despite treatment, please seek an in-person evaluation ASAP. If you note any significant shortness of breath or any chest pain, please seek ED evaluation. Please do not delay care!  ANYONE WHO HAS FLU SYMPTOMS SHOULD: Stay home. The flu is highly contagious and going out or to work exposes others! Be sure to drink plenty of fluids. Water is fine as well as fruit juices, sodas and electrolyte beverages. You may want to stay away from caffeine or alcohol.  If you are nauseated, try taking small sips of liquids. How do you know if you are getting enough fluid? Your urine should be a pale yellow or almost colorless. Get rest. Taking a steamy shower or using a humidifier may help nasal congestion and ease sore throat pain. Using a saline nasal spray works much the same way. Cough drops, hard candies and sore throat lozenges may ease your cough. Line up a caregiver. Have someone check on you regularly.  GET HELP RIGHT AWAY IF: You cannot keep down liquids  or your medications. You become short of breath Your fell like you are going to pass out or loose consciousness. Your symptoms persist after you have completed your treatment plan  MAKE SURE YOU  Understand these instructions. Will watch your condition. Will get help right away if you are not doing well or get worse.  Your e-visit answers were reviewed by a board certified advanced clinical practitioner to complete your personal care plan.  Depending on the condition, your plan could have included both over the counter or prescription medications.  If there is a problem please reply  once you have received a response from your provider.  Your safety is important to us .  If you have drug allergies check your prescription carefully.    You can use MyChart to ask questions about todays visit, request a non-urgent call back, or ask for a work or school excuse for 24 hours related to this e-Visit. If it has been greater than 24 hours you will need to follow up with your provider, or enter a new e-Visit to address those concerns.  You will get an e-mail in the next two days asking about your experience.  I hope that your e-visit has been valuable and will speed your recovery. Thank you for using e-visits.   I have spent 5 minutes in review of e-visit questionnaire, review and updating patient chart, medical decision making and response to patient.   Elsie Velma Lunger, PA-C

## 2024-05-12 ENCOUNTER — Other Ambulatory Visit: Payer: Self-pay

## 2024-05-12 ENCOUNTER — Encounter: Payer: Self-pay | Admitting: Family Medicine

## 2024-05-12 ENCOUNTER — Telehealth (INDEPENDENT_AMBULATORY_CARE_PROVIDER_SITE_OTHER): Admitting: Family Medicine

## 2024-05-12 DIAGNOSIS — J101 Influenza due to other identified influenza virus with other respiratory manifestations: Secondary | ICD-10-CM | POA: Insufficient documentation

## 2024-05-12 MED ORDER — ONDANSETRON 4 MG PO TBDP
4.0000 mg | ORAL_TABLET | Freq: Three times a day (TID) | ORAL | 0 refills | Status: AC | PRN
  Filled 2024-05-12: qty 20, 7d supply, fill #0

## 2024-05-12 MED ORDER — MOMETASONE FUROATE 50 MCG/ACT NA SUSP
2.0000 | Freq: Every day | NASAL | 0 refills | Status: AC
  Filled 2024-05-12: qty 10, 30d supply, fill #0

## 2024-05-12 MED ORDER — PROMETHAZINE-DM 6.25-15 MG/5ML PO SYRP
5.0000 mL | ORAL_SOLUTION | Freq: Four times a day (QID) | ORAL | 0 refills | Status: AC | PRN
  Filled 2024-05-12: qty 118, 6d supply, fill #0

## 2024-05-12 MED ORDER — OSELTAMIVIR PHOSPHATE 75 MG PO CAPS
75.0000 mg | ORAL_CAPSULE | Freq: Two times a day (BID) | ORAL | 0 refills | Status: AC
  Filled 2024-05-12: qty 10, 5d supply, fill #0

## 2024-05-12 NOTE — Progress Notes (Signed)
 "    Primary Care / Sports Medicine Virtual Visit  Patient Information:  Patient ID: [name], female DOB: Feb 27, 1981 Age: 43 y.o. MRN: [id number]   [name] is a pleasant 43 y.o. female presenting with the following:  Chief Complaint  Patient presents with   Influenza    Flu A positive test this morning. Cough , fever, sore throat , and body aches since Saturday night.     Review of Systems: No fevers, chills, night sweats, weight loss, chest pain, or shortness of breath.   Patient Active Problem List   Diagnosis Date Noted   Influenza A 05/12/2024   Recurrent cold sores 08/10/2023   Healthcare maintenance 10/25/2021   Family history of colonic polyps 10/25/2021   Seborrheic dermatitis 10/24/2021   IUD contraception 11/16/2020   Hereditary von Willebrand disease (HCC) 10/19/2020   Asthma 07/25/2011   Prediabetes 12/16/2010   OTHER CHRONIC SINUSITIS 07/25/2010   Asthma, mild intermittent 05/23/2010   Personal history presenting hazards to health 12/16/2009   Von Willebrand's disease (HCC) 08/19/2008   Past Medical History:  Diagnosis Date   Clotting disorder    Dermatitis    GERD (gastroesophageal reflux disease)    Herpes    oral   Postpartum depression 10/19/2020   Renal stones    2008-onward   Urolithiasis 08/19/2008   Annotation: calcium phosphate   Von Willebrand disease (HCC)    age 44   Outpatient Encounter Medications as of 05/12/2024  Medication Sig   levonorgestrel (MIRENA) 20 MCG/DAY IUD by Intrauterine route.   mometasone  (NASONEX ) 50 MCG/ACT nasal spray Place 2 sprays into the nose daily.   ondansetron  (ZOFRAN -ODT) 4 MG disintegrating tablet Take 1 tablet (4 mg total) by mouth every 8 (eight) hours as needed for up to 10 days for nausea or vomiting.   oseltamivir  (TAMIFLU ) 75 MG capsule Take 1 capsule (75 mg total) by mouth 2 (two) times daily.   promethazine -dextromethorphan (PROMETHAZINE -DM) 6.25-15 MG/5ML syrup Take 5 mLs by mouth 4  (four) times daily as needed for cough.   benzonatate  (TESSALON ) 100 MG capsule Take 1 capsule (100 mg total) by mouth 3 (three) times daily as needed for cough. (Patient not taking: Reported on 05/12/2024)   [DISCONTINUED] Baloxavir Marboxil ,40 MG Dose, (XOFLUZA , 40 MG DOSE,) 1 x 40 MG TBPK Take 40 mg by mouth once for 1 dose. (Patient not taking: Reported on 05/12/2024)   No facility-administered encounter medications on file as of 05/12/2024.   Past Surgical History:  Procedure Laterality Date   KIDNEY STONE SURGERY     nephrosotmy tube  2008 and 2009   TONSILLECTOMY      Discussed the use of AI scribe software for clinical note transcription with the patient, who gave verbal consent to proceed.   Virtual Visit via MyChart Video:   I connected with [name] on 05/12/2024 via MyChart Video and verified that I am speaking with the correct person using appropriate identifiers.   The limitations, risks, security and privacy concerns of performing an evaluation and management service by MyChart Video, including the higher likelihood of inaccurate diagnoses and treatments, and the availability of in person appointments were reviewed. The possible need of an additional face-to-face encounter for complete and high quality delivery of care was discussed. The patient was also made aware that there may be a patient responsible charge related to this service. The patient expressed understanding and wishes to proceed.  Provider location is in medical facility. Patient location  is at their home, different from provider location. People involved in care of the patient during this telehealth encounter were myself, my nurse/medical assistant, and my front office/scheduling team member.  Objective findings:   General: Speaking full sentences, no audible heavy breathing. Sounds alert and appropriately interactive. Well-appearing. Face symmetric. Extraocular movements intact. Pupils equal and round.  No nasal flaring or accessory muscle use visualized.  Independent interpretation of notes and tests performed by another provider:   None  Pertinent History, Exam, Impression, and Recommendations:   History of Present Illness [name] is a 43 year old female who presents with acute influenza infection.  Upper respiratory symptoms - Acute onset of cough, nasal congestion, and sinus pressure following exposure to household contacts with influenza - Cough is persistent and severe, disrupting sleep and resulting in significant fatigue - Sinus pressure and congestion are notable - Tested positive for influenza this morning after a negative test yesterday, attributing the earlier result to testing too soon - She is the last member of her household to contract the illness despite receiving the influenza vaccine this season and practicing infection prevention measures  Gastrointestinal symptoms - Nausea present since onset of illness - Prior experience with oseltamivir  and aware of its potential gastrointestinal side effects, particularly nausea - Anticipates need for antiemetic therapy and requests ondansetron  for daytime use  Symptomatic management - Currently using pseudoephedrine, ibuprofen , and acetaminophen for symptomatic relief with some benefit - Previously used benzonatate  for cough without improvement and prefers not to use it - Has used intranasal corticosteroids in the past but does not have any available at home  Functional status - Remains in bed to rest but continues to work remotely due to job demands  Assessment and Plan Acute influenza infection Confirmed acute influenza A infection by positive test. Symptoms typical, manageable at home. - Sent prescription for oseltamivir  to pharmacy. - Advised aggressive hydration and rest. - Recommended short-term supplementation with vitamin C, D, and zinc. - Provided anticipatory guidance to monitor for worsening symptoms or  lack of improvement by Thursday or Friday, and to message if symptoms worsen or do not improve. - Advised to remain home from work until afebrile for 24 hours without antipyretics.  Acute upper respiratory symptoms Cough, sinus pressure, and congestion secondary to influenza. Cough disrupts sleep. Current OTC regimen provides some benefit. - Sent prescription for promethazine -dextromethorphan for nocturnal cough relief. - Sent prescription for mometasone  nasal spray for sinus congestion, with instructions to use OTC alternatives if not covered by insurance. - Advised to continue current regimen of pseudoephedrine, ibuprofen , and acetaminophen as tolerated.  Nausea secondary to influenza infection Nausea likely from postnasal drainage and oseltamivir . History of nausea with oseltamivir , requests antiemetic. - Sent prescription for ondansetron  orally dissolving tablets as needed for nausea.  Problem List Items Addressed This Visit     Influenza A - Primary   Relevant Medications   oseltamivir  (TAMIFLU ) 75 MG capsule   ondansetron  (ZOFRAN -ODT) 4 MG disintegrating tablet   mometasone  (NASONEX ) 50 MCG/ACT nasal spray   promethazine -dextromethorphan (PROMETHAZINE -DM) 6.25-15 MG/5ML syrup     Orders & Medications Medications:  Meds ordered this encounter  Medications   oseltamivir  (TAMIFLU ) 75 MG capsule    Sig: Take 1 capsule (75 mg total) by mouth 2 (two) times daily.    Dispense:  10 capsule    Refill:  0   ondansetron  (ZOFRAN -ODT) 4 MG disintegrating tablet    Sig: Take 1 tablet (4 mg total) by mouth every 8 (  eight) hours as needed for up to 10 days for nausea or vomiting.    Dispense:  20 tablet    Refill:  0   mometasone  (NASONEX ) 50 MCG/ACT nasal spray    Sig: Place 2 sprays into the nose daily.    Dispense:  10 mL    Refill:  0   promethazine -dextromethorphan (PROMETHAZINE -DM) 6.25-15 MG/5ML syrup    Sig: Take 5 mLs by mouth 4 (four) times daily as needed for cough.     Dispense:  118 mL    Refill:  0   No orders of the defined types were placed in this encounter.    I discussed the above assessment and treatment plan with the patient. The patient was provided an opportunity to ask questions and all were answered. The patient agreed with the plan and demonstrated an understanding of the instructions.   The patient was advised to call back or seek an in-person evaluation if the symptoms worsen or if the condition fails to improve as anticipated.    Yolanda JINNY Ku, MD, [facility]   Primary Care Sports Medicine Primary Care and Sports Medicine at [facility]   "

## 2024-05-12 NOTE — Patient Instructions (Signed)
 VISIT SUMMARY:  Today, you were seen for an acute influenza infection with upper respiratory and gastrointestinal symptoms. You tested positive for influenza and are experiencing a persistent cough, sinus pressure, and nausea.  YOUR PLAN:  ACUTE INFLUENZA INFECTION: You have a confirmed acute influenza infection with typical symptoms that can be managed at home. -A prescription for oseltamivir  has been sent to your pharmacy. -Stay well-hydrated and get plenty of rest. -Take vitamin C, D, and zinc supplements for a short period. -Monitor your symptoms and contact us  if they worsen or do not improve by Thursday or Friday. -Stay home from work until you have been fever-free for 24 hours without using fever-reducing medications.  ACUTE UPPER RESPIRATORY SYMPTOMS: You have a persistent cough, sinus pressure, and congestion due to the influenza. -A prescription for promethazine -dextromethorphan has been sent to help with your nighttime cough. -A prescription for mometasone  nasal spray has been sent for your sinus congestion. Use over-the-counter alternatives if it is not covered by your insurance. -Continue using pseudoephedrine, ibuprofen , and acetaminophen as needed.  NAUSEA SECONDARY TO INFLUENZA INFECTION: You are experiencing nausea likely due to postnasal drainage and the medication oseltamivir . -A prescription for ondansetron  orally dissolving tablets has been sent to help with your nausea.

## 2024-05-14 ENCOUNTER — Ambulatory Visit
Admission: RE | Admit: 2024-05-14 | Discharge: 2024-05-14 | Disposition: A | Discharge status: Home / Self Care | Attending: Family Medicine | Admitting: Family Medicine

## 2024-05-14 ENCOUNTER — Encounter: Payer: Self-pay | Admitting: Family Medicine

## 2024-05-14 ENCOUNTER — Ambulatory Visit
Admission: RE | Admit: 2024-05-14 | Discharge: 2024-05-14 | Disposition: A | Source: Ambulatory Visit | Attending: Family Medicine

## 2024-05-14 ENCOUNTER — Other Ambulatory Visit: Payer: Self-pay

## 2024-05-14 ENCOUNTER — Ambulatory Visit: Admitting: Family Medicine

## 2024-05-14 ENCOUNTER — Other Ambulatory Visit: Payer: Self-pay | Admitting: Family Medicine

## 2024-05-14 VITALS — BP 116/82 | HR 128 | Ht 63.0 in

## 2024-05-14 DIAGNOSIS — J101 Influenza due to other identified influenza virus with other respiratory manifestations: Secondary | ICD-10-CM | POA: Diagnosis not present

## 2024-05-14 LAB — POCT INFLUENZA A/B
Influenza A, POC: NEGATIVE
Influenza B, POC: NEGATIVE

## 2024-05-14 LAB — POC COVID19 BINAXNOW: SARS Coronavirus 2 Ag: NEGATIVE

## 2024-05-14 MED ORDER — IPRATROPIUM BROMIDE 0.03 % NA SOLN
2.0000 | Freq: Two times a day (BID) | NASAL | 0 refills | Status: AC
  Filled 2024-05-14: qty 30, 75d supply, fill #0

## 2024-05-14 MED ORDER — METHYLPREDNISOLONE 4 MG PO TBPK
ORAL_TABLET | ORAL | 0 refills | Status: AC
  Filled 2024-05-14: qty 21, 6d supply, fill #0

## 2024-05-14 NOTE — Patient Instructions (Signed)
 VISIT SUMMARY:  During your visit, we addressed your worsening upper respiratory symptoms and acute right maxillary sinusitis. We discussed your current medications and introduced new treatments to help manage your symptoms and improve your condition.  YOUR PLAN:  UPPER RESPIRATORY SYMPTOMS: You have been experiencing worsening upper respiratory symptoms, including a persistent cough, nasal congestion, and difficulty with mucus. -Continue using intranasal mometasone , now increased to twice daily. -Start using Atrovent  nasal spray as prescribed. -Take guaifenesin and stay well-hydrated to help with mucus. -Continue taking pseudoephedrine, ibuprofen , and acetaminophen as needed. -Take vitamin C, vitamin D , and zinc supplements. -Manage your cough by suppressing it at night to help you sleep and encouraging expectoration during the day unless it becomes severe. -Use steam inhalation to help with congestion.  RIGHT MAXILLARY SINUSITIS: You have increased soreness and tenderness over the right maxillary sinus region, indicating sinusitis. -Start the Medrol  dose pack for systemic corticosteroid therapy. -Monitor your symptoms, and if there is no improvement by Monday, we may consider starting antibiotics.  FEVER AND SYSTEMIC SYMPTOMS: You have had intermittent fever and systemic symptoms requiring medication. -Continue taking ibuprofen  and acetaminophen as needed for fever and discomfort.  EXPOSURE AND INFECTION CONTROL: You have been exposed to influenza within your household and are taking measures to prevent spreading it to others. -Follow CDC guidance on isolation and masking after your symptoms improve. -Avoid contact with high-risk individuals until you are no longer contagious.

## 2024-05-14 NOTE — Telephone Encounter (Signed)
 Please review and advise.

## 2024-05-14 NOTE — Assessment & Plan Note (Signed)
 History of Present Illness [name] is a 43 year old female who presents with worsening upper respiratory symptoms and acute right maxillary sinusitis.  Upper respiratory symptoms - Worsening upper respiratory symptoms since earlier this week - Persistent cough, requiring cough medicine approximately three times daily, especially at night to facilitate sleep - Ongoing nasal congestion and copious upper airway secretions - Difficulty expectorating mucus - Positional discomfort related to secretions  Maxillary sinus pain and tenderness - Increased soreness and tenderness over the right maxillary sinus region  Fever and systemic symptoms - Intermittent fever, requiring antipyretic medications (ibuprofen  and acetaminophen) - Initial improvement in symptoms, followed by acute worsening last night  Medication use and response - Using intranasal steroid spray (likely mometasone ) once daily, possibly increased to twice daily - Taking pseudoephedrine, ibuprofen , and acetaminophen as needed for symptom control - Previously treated with oseltamivir  - Previously tolerated systemic steroids without adverse effects  Diagnostic testing - Recent COVID-19 and influenza antigen tests negative  Exposure and infection control - Household affected by influenza, with both children having recent or current symptoms - Currently staying home for the holidays and limiting contact with older family members to prevent transmission  Physical Exam VITALS: P- 130 HEENT: Right maxillary sinus tenderness. Ears normal. Pharynx normal. Sinus tenderness on right side. CHEST: Lungs clear to auscultation.  Results Labs Influenza antigen (05/14/2024): Negative; insufficient influenza antigen detected COVID-19 antigen (05/14/2024): Negative  Radiology Chest X-ray (05/14/2024): Normal lung fields with clear bronchial trees and clean margins; no focal consolidation or abnormality (Independently  interpreted)  Assessment and Plan Influenza with maxillary sinus involvement Persistent upper respiratory symptoms post-influenza, involving right maxillary sinus. Systemic corticosteroids recommended due to severity and persistent inflammation. - Prescribed Medrol  dose pack for systemic corticosteroid therapy. - Increased intranasal mometasone  to twice daily. - Prescribed Atrovent  nasal spray for anticholinergic effect. - Initiate guaifenesin and emphasized hydration. - Continued pseudoephedrine, ibuprofen , and acetaminophen as needed. - Recommended vitamin C, vitamin D , and zinc supplementation. - Guided on cough management: suppress nocturnally, encourage expectoration during the day unless severe. - Recommended steam inhalation. - Instructed to monitor symptoms; if no improvement by Monday, consider empiric antibiotics. - Provided CDC guidance on isolation and masking post-symptom improvement. - Advised to avoid contact with high-risk individuals until non-contagious. - Sent prescriptions to pharmacy and instructed to check portal for details.

## 2024-05-14 NOTE — Progress Notes (Signed)
 "    Primary Care / Sports Medicine Office Visit  Patient Information:  Patient ID: [name], female DOB: March 05, 1981 Age: 43 y.o. MRN: [id number]   [name] is a pleasant 43 y.o. female presenting with the following:  Chief Complaint  Patient presents with   Influenza    High fever , cough, congestion. Tested positive on Monday 05/12/24.     Vitals:   05/14/24 1118  BP: 116/82  Pulse: (!) 128  SpO2: 98%   Vitals:   05/14/24 1118  Height: 5' 3 (1.6 m)   Body mass index is 24.45 kg/m.  No results found.   Discussed the use of AI scribe software for clinical note transcription with the patient, who gave verbal consent to proceed.   Independent interpretation of notes and tests performed by another provider:   None  Procedures performed:   None  Pertinent History, Exam, Impression, and Recommendations:   Problem List Items Addressed This Visit     Influenza A - Primary   History of Present Illness [name] is a 43 year old female who presents with worsening upper respiratory symptoms and acute right maxillary sinusitis.  Upper respiratory symptoms - Worsening upper respiratory symptoms since earlier this week - Persistent cough, requiring cough medicine approximately three times daily, especially at night to facilitate sleep - Ongoing nasal congestion and copious upper airway secretions - Difficulty expectorating mucus - Positional discomfort related to secretions  Maxillary sinus pain and tenderness - Increased soreness and tenderness over the right maxillary sinus region  Fever and systemic symptoms - Intermittent fever, requiring antipyretic medications (ibuprofen  and acetaminophen) - Initial improvement in symptoms, followed by acute worsening last night  Medication use and response - Using intranasal steroid spray (likely mometasone ) once daily, possibly increased to twice daily - Taking pseudoephedrine, ibuprofen , and acetaminophen  as needed for symptom control - Previously treated with oseltamivir  - Previously tolerated systemic steroids without adverse effects  Diagnostic testing - Recent COVID-19 and influenza antigen tests negative  Exposure and infection control - Household affected by influenza, with both children having recent or current symptoms - Currently staying home for the holidays and limiting contact with older family members to prevent transmission  Physical Exam VITALS: P- 130 HEENT: Right maxillary sinus tenderness. Ears normal. Pharynx normal. Sinus tenderness on right side. CHEST: Lungs clear to auscultation.  Results Labs Influenza antigen (05/14/2024): Negative; insufficient influenza antigen detected COVID-19 antigen (05/14/2024): Negative  Radiology Chest X-ray (05/14/2024): Normal lung fields with clear bronchial trees and clean margins; no focal consolidation or abnormality (Independently interpreted)  Assessment and Plan Influenza with maxillary sinus involvement Persistent upper respiratory symptoms post-influenza, involving right maxillary sinus. Systemic corticosteroids recommended due to severity and persistent inflammation. - Prescribed Medrol  dose pack for systemic corticosteroid therapy. - Increased intranasal mometasone  to twice daily. - Prescribed Atrovent  nasal spray for anticholinergic effect. - Initiate guaifenesin and emphasized hydration. - Continued pseudoephedrine, ibuprofen , and acetaminophen as needed. - Recommended vitamin C, vitamin D , and zinc supplementation. - Guided on cough management: suppress nocturnally, encourage expectoration during the day unless severe. - Recommended steam inhalation. - Instructed to monitor symptoms; if no improvement by Monday, consider empiric antibiotics. - Provided CDC guidance on isolation and masking post-symptom improvement. - Advised to avoid contact with high-risk individuals until non-contagious. - Sent prescriptions to  pharmacy and instructed to check portal for details.      Relevant Medications   methylPREDNISolone  (MEDROL  DOSEPAK) 4 MG TBPK tablet  ipratropium (ATROVENT ) 0.03 % nasal spray   Other Relevant Orders   POC COVID-19 BinaxNow (Completed)   POCT Influenza A/B (Completed)     Orders & Medications Medications:  Meds ordered this encounter  Medications   methylPREDNISolone  (MEDROL  DOSEPAK) 4 MG TBPK tablet    Sig: Take for full course per package instructions    Dispense:  21 tablet    Refill:  0   ipratropium (ATROVENT ) 0.03 % nasal spray    Sig: Place 2 sprays into both nostrils every 12 (twelve) hours.    Dispense:  30 mL    Refill:  0   Orders Placed This Encounter  Procedures   POC COVID-19 BinaxNow   POCT Influenza A/B     No follow-ups on file.     [name], MD, [facility]   Primary Care Sports Medicine Primary Care and Sports Medicine at [facility]   "

## 2024-05-19 ENCOUNTER — Other Ambulatory Visit: Payer: Self-pay | Admitting: Obstetrics and Gynecology

## 2024-05-19 DIAGNOSIS — Z1231 Encounter for screening mammogram for malignant neoplasm of breast: Secondary | ICD-10-CM

## 2024-06-18 ENCOUNTER — Ambulatory Visit: Admission: RE | Admit: 2024-06-18 | Discharge: 2024-06-18 | Disposition: A | Source: Ambulatory Visit

## 2024-06-18 DIAGNOSIS — Z1231 Encounter for screening mammogram for malignant neoplasm of breast: Secondary | ICD-10-CM

## 2024-12-19 ENCOUNTER — Encounter: Admitting: Family Medicine
